# Patient Record
Sex: Male | Born: 1940
Health system: Southern US, Community
[De-identification: ages and names within clinical notes are randomized; demographics above are authoritative.]

## PROBLEM LIST (undated history)

## (undated) DIAGNOSIS — H905 Unspecified sensorineural hearing loss: Secondary | ICD-10-CM

## (undated) DIAGNOSIS — B009 Herpesviral infection, unspecified: Secondary | ICD-10-CM

## (undated) DIAGNOSIS — Z87898 Personal history of other specified conditions: Secondary | ICD-10-CM

## (undated) DIAGNOSIS — M542 Cervicalgia: Secondary | ICD-10-CM

## (undated) DIAGNOSIS — E785 Hyperlipidemia, unspecified: Secondary | ICD-10-CM

## (undated) DIAGNOSIS — Z9889 Other specified postprocedural states: Secondary | ICD-10-CM

## (undated) DIAGNOSIS — H409 Unspecified glaucoma: Secondary | ICD-10-CM

## (undated) DIAGNOSIS — M5430 Sciatica, unspecified side: Secondary | ICD-10-CM

## (undated) DIAGNOSIS — J45909 Unspecified asthma, uncomplicated: Secondary | ICD-10-CM

## (undated) DIAGNOSIS — K589 Irritable bowel syndrome without diarrhea: Secondary | ICD-10-CM

## (undated) HISTORY — DX: Herpesviral infection, unspecified: B00.9

## (undated) HISTORY — DX: Other specified postprocedural states: Z98.89

## (undated) HISTORY — DX: Unspecified asthma, uncomplicated: J45.909

## (undated) HISTORY — DX: Personal history of other specified conditions: Z87.898

## (undated) HISTORY — PX: WRIST SURGERY: SHX841

## (undated) HISTORY — DX: Sciatica, unspecified side: M54.30

## (undated) HISTORY — DX: Unspecified sensorineural hearing loss: H90.5

## (undated) HISTORY — PX: OTHER SURGICAL HISTORY: SHX169

## (undated) HISTORY — DX: Hyperlipidemia, unspecified: E78.5

## (undated) HISTORY — DX: Irritable bowel syndrome without diarrhea: K58.9

## (undated) HISTORY — DX: Cervicalgia: M54.2

## (undated) HISTORY — DX: Unspecified glaucoma: H40.9

---

## 2001-09-09 ENCOUNTER — Encounter: Payer: Self-pay | Admitting: Orthopaedic Surgery

## 2001-09-09 ENCOUNTER — Encounter: Admission: RE | Admit: 2001-09-09 | Discharge: 2001-09-09 | Payer: Self-pay | Admitting: Orthopaedic Surgery

## 2002-03-02 ENCOUNTER — Encounter: Payer: Self-pay | Admitting: Orthopaedic Surgery

## 2002-03-02 ENCOUNTER — Ambulatory Visit (HOSPITAL_COMMUNITY): Admission: RE | Admit: 2002-03-02 | Discharge: 2002-03-02 | Payer: Self-pay | Admitting: Orthopaedic Surgery

## 2003-08-28 ENCOUNTER — Emergency Department (HOSPITAL_COMMUNITY): Admission: EM | Admit: 2003-08-28 | Discharge: 2003-08-28 | Payer: Self-pay

## 2004-06-05 HISTORY — PX: COLONOSCOPY: SHX174

## 2004-06-05 LAB — HM COLONOSCOPY: HM Colonoscopy: NORMAL

## 2004-09-10 ENCOUNTER — Ambulatory Visit: Payer: Self-pay | Admitting: Internal Medicine

## 2004-09-17 ENCOUNTER — Ambulatory Visit: Payer: Self-pay | Admitting: Internal Medicine

## 2005-11-12 ENCOUNTER — Ambulatory Visit: Payer: Self-pay | Admitting: Internal Medicine

## 2005-11-18 ENCOUNTER — Ambulatory Visit: Payer: Self-pay | Admitting: Internal Medicine

## 2006-12-29 ENCOUNTER — Ambulatory Visit: Payer: Self-pay | Admitting: Internal Medicine

## 2006-12-29 LAB — CONVERTED CEMR LAB
ALT: 34 units/L (ref 0–40)
AST: 27 units/L (ref 0–37)
Albumin: 3.8 g/dL (ref 3.5–5.2)
Alkaline Phosphatase: 55 units/L (ref 39–117)
BUN: 20 mg/dL (ref 6–23)
Basophils Absolute: 0 10*3/uL (ref 0.0–0.1)
Basophils Relative: 0.2 % (ref 0.0–1.0)
Bilirubin Urine: NEGATIVE
Bilirubin, Direct: 0.1 mg/dL (ref 0.0–0.3)
CO2: 29 meq/L (ref 19–32)
Calcium: 9.2 mg/dL (ref 8.4–10.5)
Chloride: 107 meq/L (ref 96–112)
Cholesterol: 165 mg/dL (ref 0–200)
Creatinine, Ser: 0.9 mg/dL (ref 0.4–1.5)
Eosinophils Absolute: 0.2 10*3/uL (ref 0.0–0.6)
Eosinophils Relative: 3.3 % (ref 0.0–5.0)
GFR calc Af Amer: 109 mL/min
GFR calc non Af Amer: 90 mL/min
Glucose, Bld: 99 mg/dL (ref 70–99)
HCT: 35.4 % — ABNORMAL LOW (ref 39.0–52.0)
HDL: 55.2 mg/dL (ref >39.0)
Hemoglobin, Urine: NEGATIVE
Hemoglobin: 12.1 g/dL — ABNORMAL LOW (ref 13.0–17.0)
Ketones, ur: NEGATIVE mg/dL
LDL Cholesterol: 96 mg/dL (ref 0–99)
Leukocytes, UA: NEGATIVE
Lymphocytes Relative: 26 % (ref 12.0–46.0)
MCHC: 34.1 g/dL (ref 30.0–36.0)
MCV: 97.7 fL (ref 78.0–100.0)
Monocytes Absolute: 0.5 10*3/uL (ref 0.2–0.7)
Monocytes Relative: 10.6 % (ref 3.0–11.0)
Neutro Abs: 3 10*3/uL (ref 1.4–7.7)
Neutrophils Relative %: 59.9 % (ref 43.0–77.0)
Nitrite: NEGATIVE
PSA: 0.08 ng/mL — ABNORMAL LOW (ref 0.10–4.00)
Platelets: 198 10*3/uL (ref 150–400)
Potassium: 4.4 meq/L (ref 3.5–5.1)
RBC: 3.62 M/uL — ABNORMAL LOW (ref 4.22–5.81)
RDW: 12.9 % (ref 11.5–14.6)
Sodium: 143 meq/L (ref 135–145)
Specific Gravity, Urine: <=1.005 (ref 1.000–1.03)
TSH: 1.71 microintl units/mL (ref 0.35–5.50)
Total Bilirubin: 1 mg/dL (ref 0.3–1.2)
Total CHOL/HDL Ratio: 3
Total Protein, Urine: NEGATIVE mg/dL
Total Protein: 6.6 g/dL (ref 6.0–8.3)
Triglycerides: 67 mg/dL (ref 0–149)
Urine Glucose: NEGATIVE mg/dL
Urobilinogen, UA: 0.2 (ref 0.0–1.0)
VLDL: 13 mg/dL (ref 0–40)
WBC: 5 10*3/uL (ref 4.5–10.5)
pH: 6.5 (ref 5.0–8.0)

## 2007-01-08 ENCOUNTER — Ambulatory Visit: Payer: Self-pay | Admitting: Internal Medicine

## 2008-02-23 ENCOUNTER — Encounter: Payer: Self-pay | Admitting: *Deleted

## 2008-02-23 DIAGNOSIS — J45909 Unspecified asthma, uncomplicated: Secondary | ICD-10-CM | POA: Insufficient documentation

## 2008-02-23 DIAGNOSIS — Z87898 Personal history of other specified conditions: Secondary | ICD-10-CM

## 2008-02-23 DIAGNOSIS — B009 Herpesviral infection, unspecified: Secondary | ICD-10-CM | POA: Insufficient documentation

## 2008-02-23 DIAGNOSIS — K589 Irritable bowel syndrome without diarrhea: Secondary | ICD-10-CM | POA: Insufficient documentation

## 2008-02-23 DIAGNOSIS — Z9889 Other specified postprocedural states: Secondary | ICD-10-CM | POA: Insufficient documentation

## 2008-02-23 DIAGNOSIS — E785 Hyperlipidemia, unspecified: Secondary | ICD-10-CM

## 2008-02-23 HISTORY — DX: Hyperlipidemia, unspecified: E78.5

## 2008-02-23 HISTORY — DX: Other specified postprocedural states: Z98.89

## 2008-02-23 HISTORY — DX: Personal history of other specified conditions: Z87.898

## 2008-02-23 HISTORY — DX: Unspecified asthma, uncomplicated: J45.909

## 2008-02-23 HISTORY — DX: Herpesviral infection, unspecified: B00.9

## 2008-02-23 HISTORY — DX: Irritable bowel syndrome, unspecified: K58.9

## 2008-04-15 ENCOUNTER — Ambulatory Visit: Payer: Self-pay | Admitting: Internal Medicine

## 2008-04-15 LAB — CONVERTED CEMR LAB
ALT: 25 units/L (ref 0–53)
AST: 22 units/L (ref 0–37)
Albumin: 3.8 g/dL (ref 3.5–5.2)
Alkaline Phosphatase: 63 units/L (ref 39–117)
BUN: 19 mg/dL (ref 6–23)
Basophils Absolute: 0 10*3/uL (ref 0.0–0.1)
Basophils Relative: 0 % (ref 0.0–1.0)
Bilirubin Urine: NEGATIVE
Bilirubin, Direct: 0.2 mg/dL (ref 0.0–0.3)
CO2: 28 meq/L (ref 19–32)
Calcium: 8.9 mg/dL (ref 8.4–10.5)
Chloride: 106 meq/L (ref 96–112)
Cholesterol: 147 mg/dL (ref 0–200)
Creatinine, Ser: 0.9 mg/dL (ref 0.4–1.5)
Eosinophils Absolute: 0.1 10*3/uL (ref 0.0–0.7)
Eosinophils Relative: 2.5 % (ref 0.0–5.0)
GFR calc Af Amer: 109 mL/min
GFR calc non Af Amer: 90 mL/min
Glucose, Bld: 106 mg/dL — ABNORMAL HIGH (ref 70–99)
HCT: 41.7 % (ref 39.0–52.0)
HDL: 51.1 mg/dL (ref >39.0)
Hemoglobin: 14.4 g/dL (ref 13.0–17.0)
Ketones, ur: NEGATIVE mg/dL
LDL Cholesterol: 86 mg/dL (ref 0–99)
Leukocytes, UA: NEGATIVE
Lymphocytes Relative: 24.4 % (ref 12.0–46.0)
MCHC: 34.6 g/dL (ref 30.0–36.0)
MCV: 98.5 fL (ref 78.0–100.0)
Monocytes Absolute: 0.5 10*3/uL (ref 0.1–1.0)
Monocytes Relative: 8.6 % (ref 3.0–12.0)
Neutro Abs: 3.8 10*3/uL (ref 1.4–7.7)
Neutrophils Relative %: 64.5 % (ref 43.0–77.0)
Nitrite: NEGATIVE
PSA: 0.09 ng/mL — ABNORMAL LOW (ref 0.10–4.00)
Platelets: 243 10*3/uL (ref 150–400)
Potassium: 4.4 meq/L (ref 3.5–5.1)
RBC: 4.23 M/uL (ref 4.22–5.81)
RDW: 12.8 % (ref 11.5–14.6)
Sodium: 141 meq/L (ref 135–145)
Specific Gravity, Urine: 1.015 (ref 1.000–1.03)
TSH: 1.16 microintl units/mL (ref 0.35–5.50)
Total Bilirubin: 1 mg/dL (ref 0.3–1.2)
Total CHOL/HDL Ratio: 2.9
Total Protein, Urine: NEGATIVE mg/dL
Total Protein: 6.5 g/dL (ref 6.0–8.3)
Triglycerides: 52 mg/dL (ref 0–149)
Urine Glucose: NEGATIVE mg/dL
Urobilinogen, UA: 0.2 (ref 0.0–1.0)
VLDL: 10 mg/dL (ref 0–40)
WBC: 5.8 10*3/uL (ref 4.5–10.5)
pH: 6.5 (ref 5.0–8.0)

## 2008-04-22 ENCOUNTER — Ambulatory Visit: Payer: Self-pay | Admitting: Internal Medicine

## 2008-04-22 DIAGNOSIS — M542 Cervicalgia: Secondary | ICD-10-CM | POA: Insufficient documentation

## 2008-04-22 DIAGNOSIS — M5430 Sciatica, unspecified side: Secondary | ICD-10-CM | POA: Insufficient documentation

## 2008-04-22 HISTORY — DX: Cervicalgia: M54.2

## 2008-04-22 HISTORY — DX: Sciatica, unspecified side: M54.30

## 2009-05-02 ENCOUNTER — Ambulatory Visit: Payer: Self-pay | Admitting: Internal Medicine

## 2009-05-02 LAB — CONVERTED CEMR LAB
ALT: 23 units/L (ref 0–53)
AST: 24 units/L (ref 0–37)
Albumin: 4 g/dL (ref 3.5–5.2)
Alkaline Phosphatase: 81 units/L (ref 39–117)
BUN: 18 mg/dL (ref 6–23)
Basophils Absolute: 0 10*3/uL (ref 0.0–0.1)
Basophils Relative: 0.2 % (ref 0.0–3.0)
Bilirubin, Direct: 0.1 mg/dL (ref 0.0–0.3)
CO2: 29 meq/L (ref 19–32)
Calcium: 9 mg/dL (ref 8.4–10.5)
Chloride: 106 meq/L (ref 96–112)
Cholesterol: 151 mg/dL (ref 0–200)
Creatinine, Ser: 0.8 mg/dL (ref 0.4–1.5)
Eosinophils Absolute: 0.1 10*3/uL (ref 0.0–0.7)
Eosinophils Relative: 2 % (ref 0.0–5.0)
GFR calc non Af Amer: 102.64 mL/min (ref >60)
Glucose, Bld: 82 mg/dL (ref 70–99)
HCT: 40.9 % (ref 39.0–52.0)
HDL: 52 mg/dL (ref >39.00)
Hemoglobin: 13.9 g/dL (ref 13.0–17.0)
LDL Cholesterol: 85 mg/dL (ref 0–99)
Lymphocytes Relative: 20.6 % (ref 12.0–46.0)
Lymphs Abs: 1.5 10*3/uL (ref 0.7–4.0)
MCHC: 34.1 g/dL (ref 30.0–36.0)
MCV: 99.7 fL (ref 78.0–100.0)
Monocytes Absolute: 0.6 10*3/uL (ref 0.1–1.0)
Monocytes Relative: 8.8 % (ref 3.0–12.0)
Neutro Abs: 5 10*3/uL (ref 1.4–7.7)
Neutrophils Relative %: 68.4 % (ref 43.0–77.0)
PSA: 0.11 ng/mL (ref 0.10–4.00)
Platelets: 228 10*3/uL (ref 150.0–400.0)
Potassium: 4.2 meq/L (ref 3.5–5.1)
RBC: 4.1 M/uL — ABNORMAL LOW (ref 4.22–5.81)
RDW: 13 % (ref 11.5–14.6)
Sodium: 143 meq/L (ref 135–145)
Total Bilirubin: 0.8 mg/dL (ref 0.3–1.2)
Total CHOL/HDL Ratio: 3
Total Protein: 6.8 g/dL (ref 6.0–8.3)
Triglycerides: 72 mg/dL (ref 0.0–149.0)
VLDL: 14.4 mg/dL (ref 0.0–40.0)
WBC: 7.2 10*3/uL (ref 4.5–10.5)

## 2009-11-27 ENCOUNTER — Ambulatory Visit: Payer: Self-pay | Admitting: Internal Medicine

## 2009-12-01 ENCOUNTER — Telehealth: Payer: Self-pay | Admitting: Internal Medicine

## 2009-12-05 ENCOUNTER — Encounter: Payer: Self-pay | Admitting: Internal Medicine

## 2010-05-04 ENCOUNTER — Ambulatory Visit: Payer: Self-pay | Admitting: Internal Medicine

## 2010-05-04 DIAGNOSIS — H905 Unspecified sensorineural hearing loss: Secondary | ICD-10-CM | POA: Insufficient documentation

## 2010-05-04 HISTORY — DX: Unspecified sensorineural hearing loss: H90.5

## 2010-05-10 ENCOUNTER — Telehealth: Payer: Self-pay | Admitting: Internal Medicine

## 2010-05-15 ENCOUNTER — Ambulatory Visit (HOSPITAL_COMMUNITY): Admission: RE | Admit: 2010-05-15 | Discharge: 2010-05-15 | Payer: Self-pay | Admitting: Internal Medicine

## 2010-05-23 ENCOUNTER — Telehealth: Payer: Self-pay | Admitting: Internal Medicine

## 2010-06-08 ENCOUNTER — Ambulatory Visit: Payer: Self-pay | Admitting: Internal Medicine

## 2010-07-10 ENCOUNTER — Ambulatory Visit: Payer: Self-pay | Admitting: Internal Medicine

## 2010-07-10 LAB — CONVERTED CEMR LAB
Calcium: 8.9 mg/dL (ref 8.4–10.5)
GFR calc non Af Amer: 87.04 mL/min (ref >60)
Glucose, Bld: 86 mg/dL (ref 70–99)
Potassium: 5 meq/L (ref 3.5–5.1)
Sodium: 142 meq/L (ref 135–145)

## 2010-07-15 ENCOUNTER — Telehealth: Payer: Self-pay | Admitting: Internal Medicine

## 2010-08-14 ENCOUNTER — Telehealth: Payer: Self-pay | Admitting: Internal Medicine

## 2010-12-02 LAB — CONVERTED CEMR LAB
ALT: 24 units/L (ref 0–53)
AST: 26 units/L (ref 0–37)
Albumin: 4.5 g/dL (ref 3.5–5.2)
Alkaline Phosphatase: 68 units/L (ref 39–117)
BUN: 26 mg/dL — ABNORMAL HIGH (ref 6–23)
Calcium: 9.3 mg/dL (ref 8.4–10.5)
Chloride: 109 meq/L (ref 96–112)
Cholesterol: 161 mg/dL (ref 0–200)
Creatinine, Ser: 0.8 mg/dL (ref 0.4–1.5)
HDL: 56.5 mg/dL (ref >39.00)
TSH: 0.85 microintl units/mL (ref 0.35–5.50)
Total CHOL/HDL Ratio: 3
Total Protein: 7.1 g/dL (ref 6.0–8.3)
Triglycerides: 54 mg/dL (ref 0.0–149.0)

## 2010-12-04 NOTE — Consult Note (Signed)
Summary: [facility]  [facility]   Imported By: Sherian Rein 12/11/2009 10:06:20  _____________________________________________________________________  External Attachment:    Type:   Image     Comment:   External Document

## 2010-12-04 NOTE — Assessment & Plan Note (Signed)
Summary: yearly  stc   Vital Signs:  Patient profile:   70 year old male Height:      69 inches Weight:      166 pounds BMI:     24.60 O2 Sat:      97 % on Room air Temp:     98.2 degrees F oral Pulse rate:   62 / minute BP sitting:   162 / 88  (left arm) Cuff size:   regular  Vitals Entered By: Bill Salinas CMA (May 04, 2010 10:12 AM)  O2 Flow:  Room air CC: pt here for cpx/ he will get his tetanus shot today/ ab  Vision Screening:      Vision Comments: Pt had last eye exam about Feb 2011, pt has pigment prob in right eye. He recieves eye exams every 6 months   Primary Care Provider:  Jacques Navy MD  CC:  pt here for cpx/ he will get his tetanus shot today/ ab.  History of Present Illness: Patient presents for routine medical exam since. IN the interval he has been doing well. He continues to have hearing loss right, feeling of pressure build up that at times becomes very painful on the right and he has selective high frequency hearing loss as well as hyperacusia.   He  is otherwise doing well with no other medical complaints.  Preventive Screening-Counseling & Management  Alcohol-Tobacco     Alcohol drinks/day: <1     Alcohol type: all     Smoking Status: quit     Smoking Cessation Counseling: no     Year Quit: '78  Clinical Review Panels:  Immunizations   Last Tetanus Booster:  Tdap (05/04/2010)   Last Flu Vaccine:  given (08/25/2009)   Last Zoster Vaccine:  Zostavax (01/08/2007)   Current Medications (verified): 1)  Lipitor 10 Mg  Tabs (Atorvastatin Calcium) .... Take 1 Tablet By Mouth Once A Day 2)  Propecia 1 Mg  Tabs (Finasteride) .... Take Once Daily 3)  Multivitamins   Tabs (Multiple Vitamin) .... Take Once Daily 4)  Aspirin 325 Mg  Tabs (Aspirin) .... Take Once Daily 5)  Fish Oil   Oil (Fish Oil) .... Take Once Daily 6)  Timoptic 0.5 %  Soln (Timolol Maleate) .... Use Daily As Directed. 7)  Lorazepam 1 Mg  Tabs (Lorazepam) .... Take Daily As  Needed 8)  Valtrex 500 Mg  Tabs (Valacyclovir Hcl) .... Take 2 Tablets Daily As Needed 9)  Hyoscyamine Sulfate Cr 0.375 Mg  Cp12 (Hyoscyamine Sulfate) .... Take Two Times A Day As Needed 10)  Vitamin D 400 Unit Caps (Cholecalciferol) .Marland Kitchen.. 1 Cap Daily 11)  Vitamin C Cr 500 Mg Cr-Caps (Ascorbic Acid) .Marland Kitchen.. 1 Cap Daily  Allergies (verified): No Known Drug Allergies  Past History:  Past Medical History: Last updated: 02/23/2008 HYPERLIPIDEMIA (ICD-272.4) IRRITABLE BOWEL SYNDROME (ICD-564.1) HERPES SIMPLEX INFECTION, TYPE I (ICD-054.9) ASTHMA, CHILDHOOD (ICD-493.00) HEADACHES, HX OF (ICD-V13.8)    Past Surgical History: Last updated: 02/23/2008 ARTHROSCOPY, LEFT KNEE, HX OF (ICD-V45.89) * SURGICAL REPAIR OF WRIST. 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  The patient denies anorexia, fever, weight loss, weight gain, hoarseness, chest pain, syncope, dyspnea on exertion, peripheral edema, headaches, abdominal pain, hematochezia, hematuria, incontinence, muscle weakness, suspicious skin lesions, transient blindness, difficulty walking, abnormal bleeding, enlarged lymph nodes, and testicular masses.    Physical Exam  General:  Well-developed,well-nourished,in no acute distress; alert,appropriate and cooperative throughout examination Head:  Normocephalic and atraumatic without obvious abnormalities. No apparent alopecia or balding. Eyes:  No corneal or conjunctival inflammation noted. EOMI. Perrla. Funduscopic exam benign, without hemorrhages, exudates or papilledema.  Vision grossly normal. Ears:  External ear exam shows no significant lesions or deformities.  Otoscopic examination reveals clear canals, tympanic membranes are intact bilaterally without bulging, retraction, inflammation or discharge. Nose:  no external deformity and no external erythema.   Mouth:  Oral mucosa and oropharynx without lesions or exudates.  Teeth in good repair. Neck:  supple, full ROM, no thyromegaly, and no carotid bruits.   Chest Wall:  No deformities, masses, tenderness or gynecomastia noted. Lungs:  Normal respiratory effort, chest expands symmetrically. Lungs are clear to auscultation, no crackles or wheezes. Heart:  Normal rate and regular rhythm. S1 and S2 normal without gallop, murmur, click, rub or other extra sounds. Abdomen:  soft, non-tender, normal bowel sounds, no masses, no guarding, and no hepatomegaly.   Rectal:  No external abnormalities noted. Normal sphincter tone. No rectal masses or tenderness. Prostate:  Prostate gland firm and smooth, no enlargement, nodularity, tenderness, mass, asymmetry or induration. Msk:  normal ROM, no joint tenderness, no joint swelling, no joint warmth, and no redness over joints.   Pulses:  2+ radial and DP pulses Extremities:  No clubbing, cyanosis, edema, or deformity noted with normal full range of motion of all joints.   Neurologic:  No cranial nerve deficits noted. Station and gait are normal. Plantar reflexes are down-going bilaterally. DTRs are symmetrical throughout. Sensory, motor and coordinative functions appear intact. Skin:  turgor normal, color normal, no rashes, no suspicious lesions, and no ulcerations.   Cervical Nodes:  no anterior cervical adenopathy and no posterior cervical adenopathy.   Inguinal Nodes:  no R inguinal adenopathy and no L inguinal adenopathy.   Psych:  Oriented X3, memory intact for recent and remote, normally interactive, and good eye contact.     Impression & Recommendations:  Problem # 1:   SENSORINEURAL HEARING LOSS UNILATERAL (ICD-389.15) Patient continues to have hearing loss at selective frequencies along with hyperacusia. He also has sensation of pressure in the ears suggestive of eustachian tube dysfunction. With persistent symptoms despite trails of steroids and decongestants need to r/o structual abnormalities or mass effect, e.g. acoustic neuroma  Plan - MRI brain w/o contrast with special attention to eustachian tube structure.           trial of nasal inhalational steroid, Nasonex, to relieve eustachian dysfunction.  Orders: Radiology Referral (Radiology)  Problem # 2:  SCIATICA (ICD-724.3) No active symptoms at this time.  His updated medication list for this problem includes:    Aspirin 325 Mg Tabs (Aspirin) .Marland Kitchen... Take once daily  Problem # 3:  HYPERLIPIDEMIA (ICD-272.4) Due for routine lab with recommendations to follow.  His updated medication list for this problem includes:    Lipitor 10 Mg Tabs (Atorvastatin calcium) .Marland Kitchen... Take 1 tablet by mouth once a day  Orders: TLB-Lipid Panel (80061-LIPID) TLB-Hepatic/Liver Function Pnl (80076-HEPATIC) TLB-TSH (Thyroid Stimulating Hormone) (84443-TSH)  Addendum - at goal with LDL 94, HDL 56.5  Plan - continue present medication  Problem # 4:  IRRITABLE BOWEL SYNDROME (ICD-564.1) No active symptoms at this time.  Problem # 5:  Preventive Health Care (ICD-V70.0) Except for hearing issues an unremarkable interval history. Exam is normal. Lab results are excellent. Current with colorectal cancer screening. Normal prostate exam. Due for pneumonia vaccine at his convenience.  He is doing very well in retirement with a very positive attitude. No fall risk and no injury risk - he is careful with his chores and such things as heavy lifting.  In summary - a very nice gentleman who is medically stable except for on-going concern about hearing. He will be notified of MRI results when available. He will return on a as  needed basis or 1 year.   Complete Medication List: 1)  Lipitor 10 Mg Tabs (Atorvastatin calcium) .... Take 1 tablet by mouth once a day 2)  Propecia 1 Mg Tabs (Finasteride) .... Take once daily 3)  Multivitamins Tabs (Multiple vitamin) .... Take once daily 4)  Aspirin 325 Mg Tabs (Aspirin) .... Take once daily 5)  Fish Oil Oil (Fish oil) .... Take once daily 6)  Timoptic 0.5 % Soln (Timolol maleate) .... Use daily as directed. 7)  Lorazepam 1 Mg Tabs (Lorazepam) .... Take daily as needed 8)  Valtrex 500 Mg Tabs (Valacyclovir hcl) .... Take 2 tablets daily as needed 9)  Hyoscyamine Sulfate Cr 0.375 Mg Cp12 (Hyoscyamine sulfate) .... Take two times a day as needed 10)  Vitamin D 400 Unit Caps (Cholecalciferol) .Marland Kitchen.. 1 cap daily 11)  Vitamin C Cr 500 Mg Cr-caps (Ascorbic acid) .Marland Kitchen.. 1 cap daily  Other Orders: Tdap => 7yrs IM ([address]) Admin 1st Vaccine (60454) MMR Vaccine SQ (09811) Admin of Any Addtl Vaccine (91478) TLB-BMP (Basic Metabolic Panel-BMET) (80048-METABOL)  Patient: Timothy Savage Note: All result statuses are Final unless otherwise noted.  Tests: (1) Lipid Panel (LIPID)   Cholesterol               161 mg/dL                   2-956     ATP III Classification            Desirable:  < 200 mg/dL                    Borderline High:  200 - 239 mg/dL               High:  > = 240 mg/dL   Triglycerides             54.0 mg/dL                  2.1-308.6     Normal:  <150 mg/dL     Borderline High:  578 - 199 mg/dL   HDL                       46.96 mg/dL                 >29.52   VLDL Cholesterol          10.8 mg/dL                  8.4-13.2   LDL Cholesterol           94 mg/dL  0-99  CHO/HDL Ratio:  CHD Risk                             3                    Men          Women     1/2 Average Risk     3.4          3.3     Average Risk          5.0          4.4     2X Average Risk          9.6          7.1     3X Average Risk          15.0          11.0                            Tests: (2) Hepatic/Liver Function Panel (HEPATIC)   Total Bilirubin           1.1 mg/dL                   5.7-8.4   Direct Bilirubin          0.2 mg/dL                   6.9-6.2   Alkaline Phosphatase      68 U/L                      39-117   AST                       26 U/L                      0-37   ALT                       24 U/L                      0-53   Total Protein             7.1 g/dL                    9.5-2.8   Albumin                   4.5 g/dL                    4.1-3.2  Tests: (3) TSH (TSH)   FastTSH                   0.85 uIU/mL                 0.35-5.50  Tests: (4) BMP (METABOL)   Sodium                    143 mEq/L                   135-145   Potassium                 4.5 mEq/L  3.5-5.1   Chloride                  109 mEq/L                   96-112   Carbon Dioxide            28 mEq/L                    19-32   Glucose                   88 mg/dL                    56-43   BUN                  [H]  26 mg/dL                    3-29   Creatinine                0.8 mg/dL                   5.1-8.8   Calcium                   9.3 mg/dL                   4.1-66.0   GFR                       99.46 mL/min                >60   Immunizations Administered:  Tetanus Vaccine:    Vaccine Type: Tdap    Site: left deltoid    Mfr: GlaxoSmithKline    Dose: 0.5 ml    Route: IM    Given by: Ami Bullins CMA    Exp. Date: 01/26/2012    Lot #: [id number]    VIS given: 09/22/07 version given May 04, 2010.  MMR Vaccine # 1:    Vaccine Type: MMR    Site: right deltoid    Mfr: Merck    Dose: 0.5 ml    Route: IM    Given by: Ami Bullins CMA    Exp. Date: 10/26/2011    Lot #: [id number]    VIS given: 01/15/07 version given May 04, 2010.

## 2010-12-04 NOTE — Progress Notes (Signed)
Summary: RESULTS  Phone Note Call from Patient Call back at Home Phone [phone]   Summary of Call: Patient is requesting results of MRI.  Initial call taken by: Lamar Sprinkles, CMA,  May 23, 2010 11:12 AM  Follow-up for Phone Call        my apologies for being tardy. Normal study - no cause of symptoms identified on MRI brain. Follow-up by: Jacques Navy MD,  May 23, 2010 12:24 PM  Additional Follow-up for Phone Call Additional follow up Details #1::        informed pt of results Additional Follow-up by: Ami Bullins CMA,  May 23, 2010 1:13 PM

## 2010-12-04 NOTE — Progress Notes (Signed)
Summary: refill req  Phone Note Refill Request Message from:  Fax from Pharmacy on August 14, 2010 4:06 PM  Refills Requested: Medication #1:  LORAZEPAM 1 MG  TABS Take daily as needed   Last Refilled: 05/11/2010   Notes: [facility] please advise  Initial call taken by: Brenton Grills MA,  August 14, 2010 4:06 PM  Follow-up for Phone Call        ok for refill x 5 Follow-up by: Jacques Navy MD,  August 14, 2010 6:06 PM    Prescriptions: LORAZEPAM 1 MG  TABS (LORAZEPAM) Take daily as needed  #90 x 5   Entered by:   Ami Bullins CMA   Authorized by:   Jacques Navy MD   Signed by:   Bill Salinas CMA on 08/15/2010   Method used:   Telephoned to ...       [facility] [address] (retail)       [address]       [place], [place]  [address]       Ph: [phone]       Fax: [phone]   RxID:   [id number]

## 2010-12-04 NOTE — Progress Notes (Signed)
  Phone Note Refill Request Message from:  Fax from Pharmacy  Refills Requested: Medication #1:  LORAZEPAM 1 MG  TABS Take daily as needed recieved fax from [facility] on [address], Please Advise refill in absence of Dr Debby Bud  Initial call taken by: Ami Bullins CMA,  May 10, 2010 3:26 PM  Follow-up for Phone Call        done hardcopy to [facility] side B - dahlia  Follow-up by: Corwin Levins MD,  May 10, 2010 5:37 PM  Additional Follow-up for Phone Call Additional follow up Details #1::        Rx faxed to pharmacy Additional Follow-up by: Margaret Pyle, CMA,  May 11, 2010 8:06 AM    New/Updated Medications: LORAZEPAM 1 MG  TABS (LORAZEPAM) Take daily as needed Prescriptions: LORAZEPAM 1 MG  TABS (LORAZEPAM) Take daily as needed  #90 x 0   Entered and Authorized by:   Corwin Levins MD   Signed by:   Corwin Levins MD on 05/10/2010   Method used:   Print then Give to Patient   RxID:   [id number]

## 2010-12-04 NOTE — Progress Notes (Signed)
  Phone Note Outgoing Call   Reason for Call: Discuss lab or test results Summary of Call: Please call patient: metabolic panel normal - no change with addition of losartan. How is the BP ??  Thanks Initial call taken by: Jacques Navy MD,  July 15, 2010 9:42 PM  Follow-up for Phone Call        informed pt of results. Pt states no drastic change but is a little lower (high 120s over mid 70's). Pt states BP still fluctuates quite a bit. Follow-up by: Ami Bullins CMA,  July 16, 2010 2:37 PM

## 2010-12-04 NOTE — Progress Notes (Signed)
Summary: REQ FOR RX? / Timothy Savage pt  Phone Note Call from Patient Call back at [phone]   Summary of Call: Pt was told to call back if ear problem became worse. Pt c/o increase in ear pressure, pain, and bloody-green mucus from his nose. He wants to know if antibiotics will help.  ENT is not able to see pt until 12/13/09.  Initial call taken by: Lamar Sprinkles, CMA,  December 01, 2009 10:21 AM  Follow-up for Phone Call        [facility]  F/u w/Dr Kerin Kren Follow-up by: Tresa Garter MD,  December 01, 2009 11:57 AM  Additional Follow-up for Phone Call Additional follow up Details #1::        Pt informed  Additional Follow-up by: Lamar Sprinkles, CMA,  December 01, 2009 12:19 PM    New/Updated Medications: ZITHROMAX Z-PAK 250 MG TABS (AZITHROMYCIN) as dirrected Prescriptions: ZITHROMAX Z-PAK 250 MG TABS (AZITHROMYCIN) as dirrected  #1 x 0   Entered and Authorized by:   Tresa Garter MD   Signed by:   Lamar Sprinkles, CMA on 12/01/2009   Method used:   Electronically to        [facility] [address] (retail)       [address]       [place], [place]  [address]       Ph: [phone] or [phone]       Fax: [phone]   RxID:   [id number]

## 2010-12-04 NOTE — Assessment & Plan Note (Signed)
Summary: bp check/check home machine/SD  Medications Added LOSARTAN POTASSIUM 50 MG TABS (LOSARTAN POTASSIUM) 1 by mouth once daily       Nurse Visit   Vital Signs:  Patient profile:   70 year old male Pulse rate:   65 / minute BP sitting:   178 / 100  Vitals Entered By: Lamar Sprinkles, CMA (June 08, 2010 10:24 AM) CC: BP CHECK/elevated bp at last ov, verify home machine. Comments Pt in office today for BP check and to verify pt's home machine. Home machine prior to leaving is 135/86. Machine reading in office was 170/107. Manual reading was close at 178/100. Per pt, average home systolic readings are 130 to 139 and diastolic are upper 80's to low 90's. MD informed and discussed with patient. Reviewed prehypertension. Will start on ARB - losartan 50mg  daily. Follow-up lab in 4 weeks. Report back on BP readings.   Allergies: No Known Drug Allergies  Orders Added: 1)  Est. Patient Level I [84132] Prescriptions: LOSARTAN POTASSIUM 50 MG TABS (LOSARTAN POTASSIUM) 1 by mouth once daily  #30 x 12   Entered and Authorized by:   Jacques Navy MD   Signed by:   Jacques Navy MD on 06/08/2010   Method used:   Electronically to        [facility] [address] (retail)       [address]       [place], [place]  [address]       Ph: [phone] or [phone]       Fax: [phone]   RxID:   [id number]

## 2010-12-04 NOTE — Assessment & Plan Note (Signed)
Summary: hear loss, left ear x 2 days / SD   Vital Signs:  Patient profile:   70 year old male Height:      69 inches Weight:      169 pounds BMI:     25.05 O2 Sat:      97 % on Room air Temp:     98.2 degrees F oral Pulse rate:   62 / minute BP sitting:   168 / 108  (left arm) Cuff size:   regular  Vitals Entered By: Bill Salinas CMA (November 27, 2009 3:04 PM)  O2 Flow:  Room air CC: pt her with complaint of pressure and hearing loss from Left ear x 2 days/ ab   Primary Care Provider:  Jacques Navy MD  CC:  pt her with complaint of pressure and hearing loss from Left ear x 2 days/ ab.  History of Present Illness: Patient with a h/o tinnitus reight ear. He reports that he awoke sunday and could not hear out of the right ear. His tinnitus is louder along with swooshing sounds. He denies any pain, drain, recent air travel. He has had no URI symptoms, no fever, no trauma.   Current Medications (verified): 1)  Lipitor 10 Mg  Tabs (Atorvastatin Calcium) .... Take 1 Tablet By Mouth Once A Day 2)  Propecia 1 Mg  Tabs (Finasteride) .... Take Once Daily 3)  Multivitamins   Tabs (Multiple Vitamin) .... Take Once Daily 4)  Aspirin 325 Mg  Tabs (Aspirin) .... Take Once Daily 5)  Fish Oil   Oil (Fish Oil) .... Take Once Daily 6)  Timoptic 0.5 %  Soln (Timolol Maleate) .... Use Daily As Directed. 7)  Lorazepam 1 Mg  Tabs (Lorazepam) .... Take Daily As Needed 8)  Valtrex 500 Mg  Tabs (Valacyclovir Hcl) .... Take 2 Tablets Daily As Needed 9)  Hyoscyamine Sulfate Cr 0.375 Mg  Cp12 (Hyoscyamine Sulfate) .... Take Two Times A Day As Needed  Allergies (verified): No Known Drug Allergies  Review of Systems       The patient complains of decreased hearing.  The patient denies anorexia, fever, weight loss, weight gain, hoarseness, syncope, dyspnea on exertion, prolonged cough, hemoptysis, abdominal pain, muscle weakness, transient blindness, and unusual weight change.    Physical  Exam  General:  Well-developed,well-nourished,in no acute distress; alert,appropriate and cooperative throughout examination Head:  Normocephalic and atraumatic without obvious abnormalities. No apparent alopecia or balding. Ears:  EACs clear. TM's pearly. No perforation or visible abnormality. With insuffulation there is diminished movement TM right vs left but there is movement.  Neck:  supple and full ROM.   Lungs:  normal respiratory effort and normal breath sounds.   Heart:  normal rate and regular rhythm.   Neurologic:  alert & oriented X3, cranial nerves II-XII intact, and gait normal.     Impression & Recommendations:  Problem # 1:  HEARING LOSS, RIGHT EAR (ICD-389.9)  Unrevealing exam. Suspect possible eustachian tube dysfunction vs progressive sensori-neural hearing loss vs intra-cranial/CNS origin.  Plan  pseudoephedrine 30mg  three times a day         refer to audiology and ENT  Orders: Audiology (Audio)  Complete Medication List: 1)  Lipitor 10 Mg Tabs (Atorvastatin calcium) .... Take 1 tablet by mouth once a day 2)  Propecia 1 Mg Tabs (Finasteride) .... Take once daily 3)  Multivitamins Tabs (Multiple vitamin) .... Take once daily 4)  Aspirin 325 Mg Tabs (Aspirin) .... Take once daily  5)  Fish Oil Oil (Fish oil) .... Take once daily 6)  Timoptic 0.5 % Soln (Timolol maleate) .... Use daily as directed. 7)  Lorazepam 1 Mg Tabs (Lorazepam) .... Take daily as needed 8)  Valtrex 500 Mg Tabs (Valacyclovir hcl) .... Take 2 tablets daily as needed 9)  Hyoscyamine Sulfate Cr 0.375 Mg Cp12 (Hyoscyamine sulfate) .... Take two times a day as needed   Preventive Care Screening  Last Flu Shot:    Date:  08/25/2009    Results:  given

## 2011-02-26 ENCOUNTER — Other Ambulatory Visit: Payer: Self-pay | Admitting: *Deleted

## 2011-02-27 MED ORDER — LORAZEPAM 1 MG PO TABS: 1.0000 mg | ORAL_TABLET | Freq: Every day | ORAL | Status: DC | PRN

## 2011-02-27 NOTE — Telephone Encounter (Signed)
Ok to fill x 5

## 2011-02-27 NOTE — Telephone Encounter (Signed)
Called refill into pharmacy [facility]) had to leave on vm. Epic updated

## 2011-03-22 NOTE — Assessment & Plan Note (Signed)
[facility]                           PRIMARY CARE OFFICE NOTE   Timothy Savage, Timothy Savage                      MRN:          [id number]  DATE:01/08/2007                            DOB:          12/11/1940    Mr. Situ is a 70 year old gentleman who presents for routine follow up  evaluation and exam.  His last full physical was November 18, 2005.  I  have treated in the interval.  He is doing well with no new medical  complaints or problems.   PAST MEDICAL HISTORY:  Is well documented my note of September 17, 2004,  with no changes or additions.  Of note, the patient is currently up to  date with colonoscopy August of 2005, with followup in 2015.  He has  been screened for aneurysm in 2003 and this was negative.  Patient's  last EKG was a stress test in 2003.   CURRENT MEDICATIONS:  1. Propecia 1 mg daily.  2. Multivitamin daily.  3. Lipitor 10 mg daily.  4. Aspirin 325 mg daily.  5. Timolol eye drops 5%.  6. Hyoscyamine 0.375 mg b.i.d. p.r.n.  7. Lorazepam 1 mg daily p.r.n.  8. Valtrex 1000 mg daily p.r.n. for outbreak of fever blisters.   Patient has continued to see Dr. Charlotte Sanes, at [facility],  for his pigmentary dispersion syndrome and is being conservatively  managed for any possible early glaucoma risk with timolol eye drops.   REVIEW OF SYSTEMS:  Negative for any constitutional, cardiovascular,  respiratory, GI or GU complaints.   EXAMINATION:  Temperature 97.9, blood pressure 151/98, pulse 70, weight  165.8.  GENERAL APPEARANCE:  A well-nourished, slender, fit appearing Caucasian  male in no acute distress.  HEENT:  Normocephalic, atraumatic, EACs and TMs were normal.  Oropharynx  with native dentition in good repair.  No buccal or palatal lesions were  noted.  Posterior pharynx was clear.  Conjunctiva and sclera was clear.  PERRLA, EOMI.  Funduscopic exam was deferred to Dr. Charlotte Sanes.  NECK:  Supple without thyromegaly.  NOSE:  No adenopathy was noted in the cervical, supraclavicular or  inguinal region.  CHEST:  No CVA tenderness.  LUNGS:  Clear with no rales, wheezes or rhonchi.  CARDIOVASCULAR:  Radial pulse 2+, no JVD or carotid bruits.  He had a  quiet precordium with a regular rate and rhythm without murmurs, rubs or  gallops.  ABDOMEN:  Scaphoid with no organosplenomegaly, no guarding, no rebound,  no tenderness.  GENITALIA:  Normal male phallus, bilaterally descended testicles.  RECTAL:  Normal sphincter tone was noted.  Prostate was smooth, round,  normal in size and contour.  EXTREMITIES:  Without clubbing, cyanosis, edema or deformity.  NEUROLOGIC:  Nonfocal.   DATABASE:  A 12-lead electrocardiogram with a sinus bradycardia at 56  b.p.m., poor R wave in V1 but no real evidence of acute injury or  chronic injury.   LABORATORY:  Hemoglobin 12.1 g, white count was 5000 with a normal  differential.  Chemistries were unremarkable with a glucose of 99,  creatinine of 0.9.  Liver functions were normal.  Cholesterol 165,  triglycerides 67, HDL 55.2, LDL 96.  Thyroid function normal with a TSH  of 1.71, PSA was normal at 0.08.  Urinalysis was negative.   ASSESSMENT AND PLAN:  Hypertension.  Patient with mild white coat  hypertension with blood pressures running higher here than at home.  He  has had his blood pressure monitor checked for accuracy.   PLAN:  1. Patient to continue monitoring his blood pressure, no medication is      indicated.  2. Lipids.  The patient has had an excellent response to low-dose      Lipitor and will continue the same.  3. Ophthal per Dr. Charlotte Sanes.  Patient will continue on timolol eye      drops.  4. Health maintenance.  Patient is current with colorectal cancer      screening as noted.  He is given Zosta vaccine at today's visit.      He is also given pneumonia vaccine at today's visit.  Would      recommend he consider adult MMR, one dose, at his next annual       visit.   In summary,  this is a very pleasant gentleman who is medically stable  and healthy.  He will return as needed or in 1 year.     Rosalyn Gess Norins, MD  Electronically Signed    MEN/MedQ  DD: 01/08/2007  DT: 01/08/2007  Job #: [id number]   cc:   Newell Coral

## 2011-05-07 ENCOUNTER — Encounter: Payer: Self-pay | Admitting: Internal Medicine

## 2011-05-09 ENCOUNTER — Ambulatory Visit (INDEPENDENT_AMBULATORY_CARE_PROVIDER_SITE_OTHER): Payer: Medicare Other | Admitting: Internal Medicine

## 2011-05-09 ENCOUNTER — Encounter: Payer: Self-pay | Admitting: Internal Medicine

## 2011-05-09 ENCOUNTER — Other Ambulatory Visit (INDEPENDENT_AMBULATORY_CARE_PROVIDER_SITE_OTHER): Payer: Medicare Other

## 2011-05-09 VITALS — BP 146/90 | HR 64 | Temp 98.0°F | Wt 167.0 lb

## 2011-05-09 DIAGNOSIS — Z136 Encounter for screening for cardiovascular disorders: Secondary | ICD-10-CM

## 2011-05-09 DIAGNOSIS — Z0000 Encounter for general adult medical examination without abnormal findings: Secondary | ICD-10-CM

## 2011-05-09 DIAGNOSIS — Z125 Encounter for screening for malignant neoplasm of prostate: Secondary | ICD-10-CM

## 2011-05-09 DIAGNOSIS — T782XXA Anaphylactic shock, unspecified, initial encounter: Secondary | ICD-10-CM

## 2011-05-09 DIAGNOSIS — E785 Hyperlipidemia, unspecified: Secondary | ICD-10-CM

## 2011-05-09 DIAGNOSIS — H905 Unspecified sensorineural hearing loss: Secondary | ICD-10-CM

## 2011-05-09 LAB — HEPATIC FUNCTION PANEL
AST: 26 U/L (ref 0–37)
Alkaline Phosphatase: 69 U/L (ref 39–117)
Bilirubin, Direct: 0.1 mg/dL (ref 0.0–0.3)
Total Protein: 6.9 g/dL (ref 6.0–8.3)

## 2011-05-09 LAB — COMPREHENSIVE METABOLIC PANEL
AST: 26 U/L (ref 0–37)
BUN: 16 mg/dL (ref 6–23)
Calcium: 9.2 mg/dL (ref 8.4–10.5)
Chloride: 105 mEq/L (ref 96–112)
Creatinine, Ser: 0.9 mg/dL (ref 0.4–1.5)
GFR: 86.83 mL/min (ref >60.00)
Total Bilirubin: 1.1 mg/dL (ref 0.3–1.2)

## 2011-05-09 LAB — LIPID PANEL
Cholesterol: 158 mg/dL (ref 0–200)
HDL: 57.1 mg/dL (ref >39.00)
LDL Cholesterol: 88 mg/dL (ref 0–99)
Triglycerides: 64 mg/dL (ref 0.0–149.0)

## 2011-05-09 LAB — PSA: PSA: 0.09 ng/mL — ABNORMAL LOW (ref 0.10–4.00)

## 2011-05-09 MED ORDER — FINASTERIDE 1 MG PO TABS: 1.0000 mg | ORAL_TABLET | Freq: Every day | ORAL | Status: DC

## 2011-05-09 MED ORDER — VALACYCLOVIR HCL 500 MG PO TABS: 500.0000 mg | ORAL_TABLET | Freq: Every day | ORAL | Status: DC

## 2011-05-09 MED ORDER — ATORVASTATIN CALCIUM 10 MG PO TABS: 10.0000 mg | ORAL_TABLET | Freq: Every day | ORAL | Status: DC

## 2011-05-09 MED ORDER — HYOSCYAMINE SULFATE 0.375 MG PO TB12: 0.3750 mg | ORAL_TABLET | Freq: Two times a day (BID) | ORAL | Status: DC | PRN

## 2011-05-09 MED ORDER — LOSARTAN POTASSIUM 50 MG PO TABS: 50.0000 mg | ORAL_TABLET | Freq: Every day | ORAL | Status: DC

## 2011-05-09 NOTE — Assessment & Plan Note (Addendum)
Excellent control on present medication. There should be no intolerance problems due to generic product.   Plan - continue present medications.           For worsening muscle pain will need to check  CK total.

## 2011-05-09 NOTE — Assessment & Plan Note (Signed)
Interval history is negative. Physical exam in unremarkable. Lab results are in normal range. Colorectal cancer screening - last study August '05. PSA is very low and normal. Immunizations 0- Tdap - July '11; Shingles vaccine March '08; pneumonia vaccine - ?. 12 Lead EKG - poor R wave in V1-V2. This is unchanged from previous EKG. Patient had a pre-EMR stress test and the paper chart is being pulled for review.   IN summary - a very nice man who is medically stable at this time. He will return as needed or in 1 year.

## 2011-05-09 NOTE — Assessment & Plan Note (Signed)
Stable with no significant progression of hearing loss reported by the patient.  He is not ready for amplification or frequency modulating devices.

## 2011-05-09 NOTE — Progress Notes (Signed)
Subjective:    Patient ID: Timothy Savage, male    DOB: Sep 12, 1941, 70 y.o.   MRN: [id number]  HPI   The patient is here for annual Medicare wellness examination and management of other chronic and acute problems. He has switched to generic lipitor and now has some more soreness. Interval history is unchanged.     The risk factors are reflected in the social history.  The roster of all physicians providing medical care to patient - is listed in the Snapshot section of the chart.  Activities of daily living:  The patient is 100% inedpendent in all ADLs: dressing, toileting, feeding as well as independent mobility  Home safety : The patient has smoke detectors in the home. They wear seatbelts. Firearms are present in the home, kept in a safe fashion. There is no violence in the home.   There is no risks for hepatitis, STDs or HIV. There is no  history of blood transfusion. They have no travel history to infectious disease endemic areas of the world.  The patient has seen their dentist in the last six month. They have  seen their eye doctor in the last year. They admit to hearing difficulty right ear with some frequency modulation and have not had audiologic testing in the last year.  They do not  have excessive sun exposure. Discussed the need for sun protection: hats, long sleeves and use of sunscreen if there is significant sun exposure.   Diet: the importance of a healthy diet is discussed. They do have a healthy diet.  The patient has a regular exercise program: Walking,light weight lifting , 40 min duration, 2 to 3 times per week.  The benefits of regular aerobic exercise were discussed.  Depression screen: there are no signs or vegative symptoms of depression- irritability, change in appetite, anhedonia, sadness/tearfullness.  Cognitive assessment: the patient manages all their financial and personal affairs and is actively engaged. They could relate day,date,year and events; recalled  3/3 objects at 3 minutes; performed clock-face test normally.  The following portions of the patient's history were reviewed and updated as appropriate: allergies, current medications, past family history, past medical history,  past surgical history, past social history  and problem list.  Vision, hearing, body mass index were assessed and reviewed.   During the course of the visit the patient was educated and counseled about appropriate screening and preventive services including : fall prevention , diabetes screening, nutrition counseling, colorectal cancer screening, and recommended immunizations.   Review of Systems Review of Systems  Constitutional:  Negative for fever, chills, activity change and unexpected weight change.  HEENT:  Negative for hearing loss, ear pain, congestion, neck stiffness and postnasal drip. Negative for sore throat or swallowing problems. Negative for dental complaints.   Eyes: Negative for vision loss or change in visual acuity.  Respiratory: Negative for chest tightness and wheezing.   Cardiovascular: Negative for chest pain and palpitation. No decreased exercise tolerance Gastrointestinal: No change in bowel habit. No bloating or gas. No reflux or indigestion Genitourinary: Negative for urgency, frequency, flank pain and difficulty urinating.  Musculoskeletal: Negative for myalgias, back pain, and gait problem. Arthralgia - left knee - no major limitations in activities.  Neurological: Negative for dizziness, tremors, weakness and headaches.  Hematological: Negative for adenopathy.  Psychiatric/Behavioral: Negative for behavioral problems and dysphoric mood.       Objective:   Physical Exam Vital signs reviewed-stable Gen'l: Well nourished well developed whitemale in no acute distress who  looks younger than his stated age HEENT:  Head: Normocephalic and atraumatic.  Right Ear: External ear normal. EAC/TM nl Left Ear: External ear normal.  EAC/TM  nl Nose: Nose normal.  Mouth/Throat: Oropharynx is clear and moist. Dentition - native, in good repair. No buccal or palatal lesions. Posterior pharynx clear. Eyes: Conjunctivae and sclera clear. EOM intact. Pupils are equal, round, and reactive to light. Right eye exhibits no discharge. Left eye exhibits no discharge. Fundiscopic exam deferred to opthalmology Neck: Normal range of motion. Neck supple. No JVD present. No tracheal deviation present. No thyromegaly present.  Cardiovascular: Normal rate, regular rhythm, no gallop, no friction rub, no murmur heard.      Quiet precordium. 2+ radial and DP pulses . No carotid bruits Pulmonary/Chest: Effort normal. No respiratory distress or increased WOB, no wheezes, no rales. No chest wall deformity or CVAT. Abdominal: Soft. Bowel sounds are normal in all quadrants. He exhibits no distension, no tenderness, no rebound or guarding, No heptosplenomegaly  Genitourinary: deferred  Musculoskeletal: Normal range of motion. He exhibits no edema and no tenderness.       Small and large joints without redness, synovial thickening or deformity. Full range of motion preserved about all small, median and large joints.  Lymphadenopathy:    He has no cervical or supraclavicular adenopathy.  Neurological: He is alert and oriented to person, place, and time. CN II-XII intact. DTRs 2+ and symmetrical biceps, radial and patellar tendons. Cerebellar function normal with no tremor, rigidity, normal gait and station.  Skin: Skin is warm and dry. No rash noted. No erythema.  Psychiatric: He has a normal mood and affect. His behavior is normal. Thought content normal.   Lab Results  Component Value Date   WBC 7.2 05/02/2009   HGB 13.9 05/02/2009   HCT 40.9 05/02/2009   PLT 228.0 05/02/2009   CHOL 158 05/09/2011   TRIG 64.0 05/09/2011   HDL 57.10 05/09/2011   ALT 25 05/09/2011   ALT 25 05/09/2011   AST 26 05/09/2011   AST 26 05/09/2011   NA 138 05/09/2011   K 4.6 05/09/2011   CL 105  05/09/2011   CREATININE 0.9 05/09/2011   BUN 16 05/09/2011   CO2 28 05/09/2011   TSH 0.85 05/04/2010   PSA 0.09* 05/09/2011   Lab Results  Component Value Date   LDLCALC 88 05/09/2011            Assessment & Plan:

## 2011-05-10 LAB — CK TOTAL AND CKMB (NOT AT ARMC): Total CK: 114 U/L (ref 7–232)

## 2011-05-12 ENCOUNTER — Encounter: Payer: Self-pay | Admitting: Internal Medicine

## 2011-05-20 ENCOUNTER — Telehealth: Payer: Self-pay

## 2011-05-20 NOTE — Telephone Encounter (Signed)
Mailed to pt

## 2011-05-20 NOTE — Telephone Encounter (Signed)
Requesting copy of labs including CKMB.

## 2011-07-01 ENCOUNTER — Other Ambulatory Visit: Payer: Self-pay | Admitting: Internal Medicine

## 2011-08-01 ENCOUNTER — Other Ambulatory Visit: Payer: Self-pay | Admitting: *Deleted

## 2011-08-01 MED ORDER — ATORVASTATIN CALCIUM 10 MG PO TABS: 10.0000 mg | ORAL_TABLET | Freq: Every day | ORAL | Status: DC

## 2011-09-21 ENCOUNTER — Other Ambulatory Visit: Payer: Self-pay | Admitting: Internal Medicine

## 2012-05-27 ENCOUNTER — Other Ambulatory Visit: Payer: Self-pay

## 2012-05-27 MED ORDER — FINASTERIDE 1 MG PO TABS: 1.0000 mg | ORAL_TABLET | Freq: Every day | ORAL | Status: DC

## 2012-05-27 NOTE — Telephone Encounter (Signed)
Pt is requesting to pick up hardcopy of Propecia. Okay to print?

## 2012-05-27 NOTE — Telephone Encounter (Signed)
Patient notified of Rx ready to pick up

## 2012-05-31 ENCOUNTER — Other Ambulatory Visit: Payer: Self-pay | Admitting: Internal Medicine

## 2012-07-22 ENCOUNTER — Ambulatory Visit (INDEPENDENT_AMBULATORY_CARE_PROVIDER_SITE_OTHER): Payer: Medicare Other | Admitting: Internal Medicine

## 2012-07-22 ENCOUNTER — Ambulatory Visit (INDEPENDENT_AMBULATORY_CARE_PROVIDER_SITE_OTHER)
Admission: RE | Admit: 2012-07-22 | Discharge: 2012-07-22 | Disposition: A | Discharge status: Home / Self Care | Payer: Medicare Other | Source: Ambulatory Visit | Attending: Internal Medicine | Admitting: Internal Medicine

## 2012-07-22 ENCOUNTER — Other Ambulatory Visit (INDEPENDENT_AMBULATORY_CARE_PROVIDER_SITE_OTHER): Payer: Medicare Other

## 2012-07-22 ENCOUNTER — Encounter: Payer: Self-pay | Admitting: Internal Medicine

## 2012-07-22 VITALS — BP 160/88 | HR 75 | Temp 97.4°F | Resp 16 | Wt 163.0 lb

## 2012-07-22 DIAGNOSIS — Z0000 Encounter for general adult medical examination without abnormal findings: Secondary | ICD-10-CM

## 2012-07-22 DIAGNOSIS — H905 Unspecified sensorineural hearing loss: Secondary | ICD-10-CM

## 2012-07-22 DIAGNOSIS — E785 Hyperlipidemia, unspecified: Secondary | ICD-10-CM

## 2012-07-22 DIAGNOSIS — B009 Herpesviral infection, unspecified: Secondary | ICD-10-CM

## 2012-07-22 DIAGNOSIS — M5430 Sciatica, unspecified side: Secondary | ICD-10-CM

## 2012-07-22 DIAGNOSIS — Z23 Encounter for immunization: Secondary | ICD-10-CM

## 2012-07-22 DIAGNOSIS — K589 Irritable bowel syndrome without diarrhea: Secondary | ICD-10-CM

## 2012-07-22 LAB — HEPATIC FUNCTION PANEL
ALT: 25 U/L (ref 0–53)
AST: 24 U/L (ref 0–37)
Albumin: 4.3 g/dL (ref 3.5–5.2)
Total Protein: 7.4 g/dL (ref 6.0–8.3)

## 2012-07-22 LAB — COMPREHENSIVE METABOLIC PANEL
ALT: 25 U/L (ref 0–53)
CO2: 28 mEq/L (ref 19–32)
GFR: 96.1 mL/min (ref >60.00)
Sodium: 140 mEq/L (ref 135–145)
Total Bilirubin: 1 mg/dL (ref 0.3–1.2)
Total Protein: 7.4 g/dL (ref 6.0–8.3)

## 2012-07-22 LAB — LIPID PANEL
Cholesterol: 162 mg/dL (ref 0–200)
VLDL: 14 mg/dL (ref 0.0–40.0)

## 2012-07-22 MED ORDER — HYOSCYAMINE SULFATE 0.375 MG PO TB12: 0.3750 mg | ORAL_TABLET | Freq: Two times a day (BID) | ORAL | Status: DC | PRN

## 2012-07-22 MED ORDER — FINASTERIDE 1 MG PO TABS: 1.0000 mg | ORAL_TABLET | Freq: Every day | ORAL | Status: DC

## 2012-07-22 MED ORDER — TIMOLOL MALEATE 0.5 % OP SOLN: 1.0000 [drp] | Freq: Every day | OPHTHALMIC | Status: DC

## 2012-07-22 MED ORDER — TIMOLOL MALEATE 0.5 % OP SOLN: 1.0000 [drp] | Freq: Every day | OPHTHALMIC | Status: AC

## 2012-07-22 MED ORDER — ATORVASTATIN CALCIUM 10 MG PO TABS: 10.0000 mg | ORAL_TABLET | Freq: Every day | ORAL | Status: DC

## 2012-07-22 MED ORDER — LORAZEPAM 1 MG PO TABS: 1.0000 mg | ORAL_TABLET | Freq: Every day | ORAL | Status: DC | PRN

## 2012-07-22 MED ORDER — LOSARTAN POTASSIUM 50 MG PO TABS
50.0000 mg | ORAL_TABLET | Freq: Every day | ORAL | Status: DC
Start: 2012-07-22 — End: 2013-07-28

## 2012-07-22 MED ORDER — VALACYCLOVIR HCL 500 MG PO TABS: 500.0000 mg | ORAL_TABLET | Freq: Two times a day (BID) | ORAL | Status: DC

## 2012-07-22 MED ORDER — LOSARTAN POTASSIUM 50 MG PO TABS: 50.0000 mg | ORAL_TABLET | Freq: Every day | ORAL | Status: DC

## 2012-07-22 NOTE — Progress Notes (Signed)
Subjective:    Patient ID: Timothy Savage, male    DOB: 01-27-1941, 71 y.o.   MRN: [id number]  HPI The patient is here for annual Medicare wellness examination and management of other chronic and acute problems.  Had a bout of sciatica - mostly right. He took aleve with some help. He did do some stretching exercise that also wasn't very helpful. He did work through it. Reviewed chart - no lumbar spine imaging. He continues to have some discomfort, especially doing push-ups.   Needs pneumonia vaccine.   The risk factors are reflected in the social history.  The roster of all physicians providing medical care to patient - is listed in the Snapshot section of the chart.  Activities of daily living:  The patient is 100% inedpendent in all ADLs: dressing, toileting, feeding as well as independent mobility  Home safety : The patient has smoke detectors in the home. Fall- needs fall survey. They wear seatbelts. firearms are present in the home, kept in a safe fashion. There is no violence in the home.   There is no risks for hepatitis, STDs or HIV. There is no   history of blood transfusion. They have no travel history to infectious disease endemic areas of the world.  The patient has seen their dentist in the last six month. They have seen their eye doctor in the last year. They deny any progress hearing loss right ear and have not had audiologic testing in the last year.    They do not  have excessive sun exposure. Discussed the need for sun protection: hats, long sleeves and use of sunscreen if there is significant sun exposure.   Diet: the importance of a healthy diet is discussed. They do have a healthy diet.  The patient has a regular exercise program: kyaking, weight machines, walking , 45 min duration, 3-4 per week.  The benefits of regular aerobic exercise were discussed.  Depression screen: there are no signs or vegative symptoms of depression- irritability, change in appetite,  anhedonia, sadness/tearfullness.  Cognitive assessment: the patient manages all their financial and personal affairs and is actively engaged.   During the course of the visit the patient was educated and counseled about appropriate screening and preventive services including : fall prevention , diabetes screening, nutrition counseling, colorectal cancer screening, and recommended immunizations.  Past Medical History  Diagnosis Date  . ARTHROSCOPY, LEFT KNEE, HX OF 02/23/2008  . ASTHMA, CHILDHOOD 02/23/2008  . HEADACHES, HX OF 02/23/2008  . HERPES SIMPLEX INFECTION, TYPE I 02/23/2008  . HYPERLIPIDEMIA 02/23/2008  . Irritable bowel syndrome 02/23/2008  . NECK PAIN 04/22/2008  . Sciatica 04/22/2008  . SENSORINEURAL HEARING LOSS UNILATERAL 05/04/2010   Past Surgical History  Procedure Date  . Left knee arthroscopy   . Wrist surgery   . Vocal cord removed   . Nasal spur excised    Family History  Problem Relation Age of Onset  . Diabetes Mother   . Hypertension Father   . Coronary artery disease Father   . Colon cancer Paternal Grandmother    History   Social History  . Marital Status: Married    Spouse Name: N/A    Number of Children: N/A  . Years of Education: N/A   Occupational History  . Not on file.   Social History Main Topics  . Smoking status: Former Smoker    Types: Cigarettes    Quit date: 11/05/1976  . Smokeless tobacco: Not on file  . Alcohol Use:  Not on file  . Drug Use: Not on file  . Sexually Active: Not on file   Other Topics Concern  . 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 Married-"78, 2 sons- '81, '87. Retired    Current Outpatient Prescriptions on File Prior to Visit  Medication Sig Dispense Refill  . ascorbic Acid (VITAMIN C) 500 MG CPCR Take 500 mg by mouth daily.        . Cholecalciferol (KP VITAMIN D) 1000 UNITS capsule Take 1,000 Units by mouth daily.         . Omega-3 Fatty Acids (FISH OIL) 1000 MG CAPS Take by mouth daily.        Marland Kitchen PROPECIA 1 MG tablet TAKE ONE (1) TABLET(S) ONCE DAILY  90 tablet  0  . DISCONTD: atorvastatin (LIPITOR) 10 MG tablet Take 1 tablet (10 mg total) by mouth daily.  90 tablet  3  . DISCONTD: hyoscyamine (LEVBID) 0.375 MG 12 hr tablet Take 1 tablet (0.375 mg total) by mouth 2 (two) times daily as needed.  180 tablet  3  . DISCONTD: losartan (COZAAR) 50 MG tablet Take 1 tablet (50 mg total) by mouth daily.  90 tablet  3     Review of Systems Constitutional:  Negative for fever, chills, activity change and unexpected weight change.  HEENT:  Negative for hearing loss, ear pain, congestion, neck stiffness and postnasal drip. Negative for sore throat or swallowing problems. Negative for dental complaints.   Eyes: Negative for vision loss or change in visual acuity.  Respiratory: Negative for chest tightness and wheezing. Negative for DOE.   Cardiovascular: Negative for chest pain or palpitations. No decreased exercise tolerance Gastrointestinal: No change in bowel habit. No bloating or gas. No reflux or indigestion Genitourinary: Negative for urgency, frequency, flank pain and difficulty urinating.  Musculoskeletal: Negative for myalgias, back pain, arthralgias and gait problem.  Neurological: Negative for dizziness, tremors, weakness and headaches.  Hematological: Negative for adenopathy.  Psychiatric/Behavioral: Negative for behavioral problems and dysphoric mood.      Objective:   Physical Exam Filed Vitals:   07/22/12 0917  BP: 160/88  Pulse: 75  Temp: 97.4 F (36.3 C)  Resp: 16   Wt Readings from Last 3 Encounters:  07/22/12 163 lb (73.936 kg)  05/09/11 167 lb (75.751 kg)  05/04/10 166 lb (75.297 kg)   Gen'l: Well nourished well developed white male in no acute distress  HEENT: Head: Normocephalic and atraumatic. Right Ear: External ear normal. EAC/TM nl. Left Ear: External ear normal.  EAC/TM nl. Nose:  Nose normal. Mouth/Throat: Oropharynx is clear and moist. Dentition - native, in good repair. No buccal or palatal lesions. Posterior pharynx clear. Eyes: Conjunctivae and sclera clear. EOM intact. Pupils are equal, round, and reactive to light. Right eye exhibits no discharge. Left eye exhibits no discharge. Neck: Normal range of motion. Neck supple. No JVD present. No tracheal deviation present. No thyromegaly present.  Cardiovascular: Normal rate, regular rhythm, no gallop, no friction rub, no murmur heard.      Quiet precordium. 2+ radial and DP pulses . No carotid bruits Pulmonary/Chest: Effort normal. No respiratory distress or increased WOB, no wheezes, no rales. Pectus excavatum noted,  no  CVAT. Abdomen: Soft. Bowel sounds are normal in all quadrants. He exhibits no distension, no tenderness, no rebound or guarding, No heptosplenomegaly  Genitourinary: deferred to age and previously normal PSAs  Musculoskeletal: Normal range of motion. He exhibits no edema and no tenderness.       Small and large joints without redness, synovial thickening or deformity. Full range of motion preserved about all small, median and large joints.  Lymphadenopathy:    He has no cervical or supraclavicular adenopathy.  Neurological: He is alert and oriented to person, place, and time. CN II-XII intact. DTRs 2+ and symmetrical biceps, radial and patellar tendons. Cerebellar function normal with no tremor, rigidity, normal gait and station.  Skin: Skin is warm and dry. No rash noted. No erythema.  Psychiatric: He has a normal mood and affect. His behavior is normal. Thought content normal.   Lab Results  Component Value Date   WBC 7.2 05/02/2009   HGB 13.9 05/02/2009   HCT 40.9 05/02/2009   PLT 228.0 05/02/2009   GLUCOSE 108* 07/22/2012   CHOL 162 07/22/2012   TRIG 70.0 07/22/2012   HDL 60.10 07/22/2012   LDLCALC 88 07/22/2012        ALT 25 07/22/2012   AST 24 07/22/2012        NA 140 07/22/2012   K 4.7 07/22/2012     CL 105 07/22/2012   CREATININE 0.8 07/22/2012   BUN 20 07/22/2012   CO2 28 07/22/2012   TSH 0.85 05/04/2010   PSA 0.09* 05/09/2011         Assessment & Plan:

## 2012-07-22 NOTE — Patient Instructions (Addendum)
Normal exam. We will check your spine for any mechanical defects. You will get a full report and labs and x-ray report.

## 2012-07-25 NOTE — Assessment & Plan Note (Signed)
Interval history negative for serious illness, surgery or injury. Physical exam is normal. He is current with colorectal cancer screening, due for follow-up exam in 2015. Discussed pros and cons of prostate cancer screening (USPHCTF recommendations reviewed and ACU April '13 recommendations) and he has aged out for further evaluation at this time.Immunizations are up to date.  In summary - a very nice man who appears to be medically stable. He does need to monitor BP at home and report back. Suspect he has "white coat" hypertension. He will return as needed or in one year.

## 2012-07-25 NOTE — Assessment & Plan Note (Signed)
Stable w/o severe bouts of pain or altered bowel habit.

## 2012-07-25 NOTE — Assessment & Plan Note (Signed)
Plain x-ray w/o evidence of surgically significant disk or arthritic disease. His symptoms have abated.  Plan  Back/sciatica related stretch and exercise - recommended going to [employer] for instructional videos

## 2012-07-25 NOTE — Assessment & Plan Note (Addendum)
Lipid panel with LDL that is close to goal of 80 or less for patient at high risk for CAD. HDL is better than goal of 60. LDL/HDL ratio is favorable. NCEP/Framingham risk calculation: 17% chance of cardiac event in the next 10 years - moderate risk. Key factors raising risk in this calculation is systolic BP and age. Substituting SBP of 120 lowers risk to 12%  Plan Continue lipitor  Monitor BP at home and report back if running SBP > 140 on a consistent basis  Continue exercise program.

## 2012-07-25 NOTE — Assessment & Plan Note (Signed)
Stable   Plan - valtrex as needed for fever blisters

## 2012-07-25 NOTE — Assessment & Plan Note (Signed)
Stable without progressive hearing loss.

## 2012-08-07 ENCOUNTER — Encounter: Payer: Self-pay | Admitting: Internal Medicine

## 2012-12-17 ENCOUNTER — Encounter: Payer: Self-pay | Admitting: Internal Medicine

## 2013-07-14 ENCOUNTER — Other Ambulatory Visit: Payer: Self-pay | Admitting: Internal Medicine

## 2013-07-28 ENCOUNTER — Encounter: Payer: Self-pay | Admitting: Family Medicine

## 2013-07-28 ENCOUNTER — Encounter (HOSPITAL_COMMUNITY): Payer: Self-pay | Admitting: Physical Medicine and Rehabilitation

## 2013-07-28 ENCOUNTER — Emergency Department (HOSPITAL_COMMUNITY)
Admission: EM | Admit: 2013-07-28 | Discharge: 2013-07-28 | Disposition: A | Discharge status: Home / Self Care | Payer: Medicare Other | Attending: Emergency Medicine | Admitting: Emergency Medicine

## 2013-07-28 ENCOUNTER — Ambulatory Visit (INDEPENDENT_AMBULATORY_CARE_PROVIDER_SITE_OTHER): Payer: Medicare Other | Admitting: Family Medicine

## 2013-07-28 ENCOUNTER — Emergency Department (HOSPITAL_COMMUNITY): Payer: Medicare Other

## 2013-07-28 ENCOUNTER — Telehealth: Payer: Self-pay | Admitting: Internal Medicine

## 2013-07-28 VITALS — BP 144/82 | HR 110 | Temp 99.7°F | Wt 163.0 lb

## 2013-07-28 DIAGNOSIS — E785 Hyperlipidemia, unspecified: Secondary | ICD-10-CM | POA: Insufficient documentation

## 2013-07-28 DIAGNOSIS — R51 Headache: Secondary | ICD-10-CM

## 2013-07-28 DIAGNOSIS — J189 Pneumonia, unspecified organism: Secondary | ICD-10-CM

## 2013-07-28 DIAGNOSIS — Z8739 Personal history of other diseases of the musculoskeletal system and connective tissue: Secondary | ICD-10-CM | POA: Insufficient documentation

## 2013-07-28 DIAGNOSIS — R509 Fever, unspecified: Secondary | ICD-10-CM

## 2013-07-28 DIAGNOSIS — J45909 Unspecified asthma, uncomplicated: Secondary | ICD-10-CM | POA: Insufficient documentation

## 2013-07-28 DIAGNOSIS — Z87891 Personal history of nicotine dependence: Secondary | ICD-10-CM | POA: Insufficient documentation

## 2013-07-28 DIAGNOSIS — J159 Unspecified bacterial pneumonia: Secondary | ICD-10-CM | POA: Insufficient documentation

## 2013-07-28 DIAGNOSIS — R110 Nausea: Secondary | ICD-10-CM | POA: Insufficient documentation

## 2013-07-28 DIAGNOSIS — Z8719 Personal history of other diseases of the digestive system: Secondary | ICD-10-CM | POA: Insufficient documentation

## 2013-07-28 DIAGNOSIS — Z8669 Personal history of other diseases of the nervous system and sense organs: Secondary | ICD-10-CM | POA: Insufficient documentation

## 2013-07-28 DIAGNOSIS — R519 Headache, unspecified: Secondary | ICD-10-CM

## 2013-07-28 DIAGNOSIS — E860 Dehydration: Secondary | ICD-10-CM | POA: Insufficient documentation

## 2013-07-28 DIAGNOSIS — Z8619 Personal history of other infectious and parasitic diseases: Secondary | ICD-10-CM | POA: Insufficient documentation

## 2013-07-28 DIAGNOSIS — Z79899 Other long term (current) drug therapy: Secondary | ICD-10-CM | POA: Insufficient documentation

## 2013-07-28 LAB — URINALYSIS, ROUTINE W REFLEX MICROSCOPIC
Bilirubin Urine: NEGATIVE
Glucose, UA: NEGATIVE mg/dL
Ketones, ur: 15 mg/dL — AB
Nitrite: NEGATIVE
Protein, ur: NEGATIVE mg/dL
Specific Gravity, Urine: 1.017 (ref 1.005–1.030)
Urobilinogen, UA: 0.2 mg/dL (ref 0.0–1.0)

## 2013-07-28 LAB — URINE MICROSCOPIC-ADD ON

## 2013-07-28 LAB — COMPREHENSIVE METABOLIC PANEL
ALT: 26 U/L (ref 0–53)
AST: 28 U/L (ref 0–37)
Albumin: 3.7 g/dL (ref 3.5–5.2)
Chloride: 105 mEq/L (ref 96–112)
Creatinine, Ser: 0.89 mg/dL (ref 0.50–1.35)
Sodium: 142 mEq/L (ref 135–145)
Total Bilirubin: 0.5 mg/dL (ref 0.3–1.2)

## 2013-07-28 LAB — CBC WITH DIFFERENTIAL/PLATELET
Basophils Absolute: 0 10*3/uL (ref 0.0–0.1)
Basophils Relative: 0 % (ref 0–1)
Eosinophils Absolute: 0 10*3/uL (ref 0.0–0.7)
HCT: 40.9 % (ref 39.0–52.0)
Lymphocytes Relative: 18 % (ref 12–46)
MCH: 33 pg (ref 26.0–34.0)
MCHC: 34.2 g/dL (ref 30.0–36.0)
Monocytes Absolute: 0.8 10*3/uL (ref 0.1–1.0)
Neutro Abs: 4.3 10*3/uL (ref 1.7–7.7)
Neutrophils Relative %: 68 % (ref 43–77)
Platelets: 187 10*3/uL (ref 150–400)
RDW: 13.9 % (ref 11.5–15.5)
WBC: 6.3 10*3/uL (ref 4.0–10.5)

## 2013-07-28 MED ORDER — KETOROLAC TROMETHAMINE 30 MG/ML IJ SOLN
30.0000 mg | Freq: Once | INTRAMUSCULAR | Status: AC
  Administered 2013-07-28: 30 mg via INTRAVENOUS
  Filled 2013-07-28: qty 1

## 2013-07-28 MED ORDER — ACETAMINOPHEN 500 MG PO TABS
1000.0000 mg | ORAL_TABLET | Freq: Once | ORAL | Status: AC
  Administered 2013-07-28: 1000 mg via ORAL
  Filled 2013-07-28: qty 2

## 2013-07-28 MED ORDER — METOCLOPRAMIDE HCL 5 MG/ML IJ SOLN
10.0000 mg | Freq: Once | INTRAMUSCULAR | Status: AC
  Administered 2013-07-28: 10 mg via INTRAVENOUS
  Filled 2013-07-28: qty 2

## 2013-07-28 MED ORDER — SODIUM CHLORIDE 0.9 % IV BOLUS (SEPSIS): 1000.0000 mL | Freq: Once | INTRAVENOUS | Status: DC

## 2013-07-28 MED ORDER — SODIUM CHLORIDE 0.9 % IV BOLUS (SEPSIS)
1000.0000 mL | Freq: Once | INTRAVENOUS | Status: AC
  Administered 2013-07-28: 1000 mL via INTRAVENOUS

## 2013-07-28 MED ORDER — PROCHLORPERAZINE EDISYLATE 5 MG/ML IJ SOLN
10.0000 mg | Freq: Once | INTRAMUSCULAR | Status: AC
  Administered 2013-07-28: 10 mg via INTRAVENOUS
  Filled 2013-07-28: qty 2

## 2013-07-28 MED ORDER — DEXAMETHASONE SODIUM PHOSPHATE 10 MG/ML IJ SOLN
10.0000 mg | Freq: Once | INTRAMUSCULAR | Status: AC
  Administered 2013-07-28: 10 mg via INTRAVENOUS
  Filled 2013-07-28: qty 1

## 2013-07-28 MED ORDER — DIPHENHYDRAMINE HCL 50 MG/ML IJ SOLN
25.0000 mg | Freq: Once | INTRAMUSCULAR | Status: AC
  Administered 2013-07-28: 25 mg via INTRAVENOUS
  Filled 2013-07-28: qty 1

## 2013-07-28 MED ORDER — AZITHROMYCIN 250 MG PO TABS: ORAL_TABLET | ORAL | Status: DC

## 2013-07-28 MED ORDER — LEVOFLOXACIN 750 MG PO TABS: 750.0000 mg | ORAL_TABLET | Freq: Every day | ORAL | Status: DC

## 2013-07-28 NOTE — ED Provider Notes (Signed)
CSN: [id number]     Arrival date & time 07/28/13  1317 History   First MD Initiated Contact with Patient 07/28/13 1603     Chief Complaint  Patient presents with  . Weakness  . Headache   (Consider location/radiation/quality/duration/timing/severity/associated sxs/prior Treatment) The history is provided by the patient.   72 year old male here as a referral from his primary care doctor's office for further evaluation of a headache. Patient reports that his symptoms began on Saturday. He states that he had diffuse myalgias, generalized weakness, rigors and fevers during that time. He thinks he may have had the flu. He reports fevers to 103 on Sunday Monday and 101 on Tuesday. He experienced some mild nausea, but no vomiting. Throughout this episode he has had a pulsating, intermittent right posterior temporal headache. He does have periods of time where he is pain-free. He denies jaw pain, vision changes, eye pain, ear pain, tinnitus, neck pain, neck stiffness, dysuria/frequency, difficulty swallowing, chest pain, cough, abdominal pain, diarrhea, rash, travel, tick bites, known sick exposures, trauma.  He tried ibuprofen, Tylenol, Aleve and aspirin. He reports these medications helped with his other symptoms, but did not help with his headache.  Past Medical History  Diagnosis Date  . ARTHROSCOPY, LEFT KNEE, HX OF 02/23/2008  . ASTHMA, CHILDHOOD 02/23/2008  . HEADACHES, HX OF 02/23/2008  . HERPES SIMPLEX INFECTION, TYPE I 02/23/2008  . HYPERLIPIDEMIA 02/23/2008  . Irritable bowel syndrome 02/23/2008  . NECK PAIN 04/22/2008  . Sciatica 04/22/2008  . SENSORINEURAL HEARING LOSS UNILATERAL 05/04/2010   Past Surgical History  Procedure Laterality Date  . Left knee arthroscopy      Dr. Cleophas Dunker  . Wrist surgery  '02    left wrist surgery: [facility] for severe fracture  [facility]    granuloma excised  . Nasal spur excised  80's  (Patesovorus)   Family History   Problem Relation Age of Onset  . Diabetes Mother   . Hypertension Father   . Coronary artery disease Father   . Heart disease Father     CAD/MI.  Marland Kitchen Colon cancer Paternal Grandmother   . Cancer Neg Hx   . COPD Neg Hx    History  Substance Use Topics  . Smoking status: Former Smoker    Types: Cigarettes    Quit date: 11/05/1976  . Smokeless tobacco: Never Used  . Alcohol Use: Yes     Comment: occ    Review of Systems  Constitutional: Positive for fever and chills.  HENT: Negative for neck pain and neck stiffness.   Eyes: Negative for photophobia and pain.  Respiratory: Negative for cough, chest tightness and shortness of breath.   Cardiovascular: Negative for chest pain and palpitations.  Gastrointestinal: Positive for nausea. Negative for vomiting, abdominal pain and diarrhea.  Endocrine: Negative for polydipsia and polyphagia.  Genitourinary: Negative for dysuria and frequency.  Musculoskeletal: Positive for myalgias. Negative for back pain and joint swelling.  Skin: Negative for rash.  Neurological: Negative for light-headedness and headaches.  Hematological: Negative for adenopathy. Does not bruise/bleed easily.  All other systems reviewed and are negative.    Allergies  Review of patient's allergies indicates no known allergies.  Home Medications   Current Outpatient Rx  Name  Route  Sig  Dispense  Refill  . acetaminophen (TYLENOL) 500 MG tablet   Oral   Take 500 mg by mouth every 6 (six) hours as needed for pain or fever.         Marland Kitchen  ascorbic Acid (VITAMIN C) 500 MG CPCR   Oral   Take 500 mg by mouth daily.          Marland Kitchen aspirin 325 MG tablet   Oral   Take 325 mg by mouth every 4 (four) hours as needed for pain or fever.         Marland Kitchen atorvastatin (LIPITOR) 10 MG tablet   Oral   Take 10 mg by mouth daily.         . Cholecalciferol (KP VITAMIN D) 1000 UNITS capsule   Oral   Take 1,000 Units by mouth daily.           . finasteride (PROPECIA) 1 MG  tablet   Oral   Take 1 tablet (1 mg total) by mouth daily.   90 tablet   3   . ibuprofen (ADVIL,MOTRIN) 200 MG tablet   Oral   Take 200 mg by mouth every 6 (six) hours as needed for pain, fever or headache.         Marland Kitchen LORazepam (ATIVAN) 1 MG tablet   Oral   Take 1 mg by mouth every 6 (six) hours as needed for anxiety (sleep).         . losartan (COZAAR) 50 MG tablet   Oral   Take 1 tablet (50 mg total) by mouth daily.   90 tablet   3   . Omega-3 Fatty Acids (FISH OIL) 1000 MG CAPS   Oral   Take by mouth daily.           . timolol (TIMOPTIC) 0.5 % ophthalmic solution   Both Eyes   Place 1 drop into both eyes daily.   10 mL   3   . valACYclovir (VALTREX) 500 MG tablet   Oral   Take 1 tablet (500 mg total) by mouth 2 (two) times daily. Take 2 tablets once daily as needed   60 tablet   3    BP 155/87  Pulse 87  Temp(Src) 98.8 F (37.1 C) (Oral)  Resp 18  Ht 5\' 10"  (1.778 m)  Wt 163 lb 3.2 oz (74.027 kg)  BMI 23.42 kg/m2  SpO2 98% Physical Exam  Vitals reviewed. Constitutional: He is oriented to person, place, and time. He appears well-developed and well-nourished. No distress.  HENT:  Head: Normocephalic.  Right Ear: External ear normal.  Left Ear: External ear normal.  Nose: Nose normal.  Mouth/Throat: Oropharynx is clear and moist. No oropharyngeal exudate.  Mucous membranes dry  Eyes: Conjunctivae and EOM are normal. Pupils are equal, round, and reactive to light.  Neck: Normal range of motion. Neck supple. No spinous process tenderness present. No Brudzinski's sign and no Kernig's sign noted.  Cardiovascular: Normal rate, regular rhythm, normal heart sounds and intact distal pulses.  Exam reveals no gallop and no friction rub.   No murmur heard. Pulmonary/Chest: Effort normal and breath sounds normal.  Abdominal: Soft. Bowel sounds are normal. He exhibits no distension. There is no tenderness.  Musculoskeletal: Normal range of motion. He exhibits no  edema and no tenderness.  Neurological: He is alert and oriented to person, place, and time. He has normal strength. No cranial nerve deficit or sensory deficit. Gait normal.  Skin: Skin is warm and dry.  Psychiatric: He has a normal mood and affect.    ED Course  Procedures (including critical care time) Labs Review Labs Reviewed  CBC WITH DIFFERENTIAL - Abnormal; Notable for the following:    Monocytes Relative  13 (*)    All other components within normal limits  COMPREHENSIVE METABOLIC PANEL - Abnormal; Notable for the following:    Glucose, Bld 109 (*)    GFR calc non Af Amer 85 (*)    All other components within normal limits  URINALYSIS, ROUTINE W REFLEX MICROSCOPIC - Abnormal; Notable for the following:    Hgb urine dipstick SMALL (*)    Ketones, ur 15 (*)    All other components within normal limits  URINE MICROSCOPIC-ADD ON   Imaging Review Dg Chest 2 View  07/28/2013   CLINICAL DATA:  Headache  EXAM: CHEST  2 VIEW  COMPARISON:  None  FINDINGS: Normal heart size. No pleural effusion or edema. There is asymmetric airspace consolidation within the right upper lobe. Left lung is clear. Within the mid thoracic spine there is a compression fracture. By report, this was present on exam from 09/09/2001.  IMPRESSION: 1. Right upper lobe pneumonia.   Electronically Signed   By: Signa Kell M.D.   On: 07/28/2013 20:48   Ct Head Wo Contrast  07/28/2013   CLINICAL DATA:  Headache with fever  EXAM: CT HEAD WITHOUT CONTRAST  TECHNIQUE: Contiguous axial images were obtained from the base of the skull through the vertex without intravenous contrast. Study was obtained within 24 hr of patient's arrival at the emergency department.  COMPARISON:  brain MRI May 15, 2010  FINDINGS: The ventricles are normal in size and configuration. There is no mass, hemorrhage, extra-axial fluid collection, or midline shift. Gray-white compartments are normal. There is no demonstrable acute infarct. Bony  calvarium appears intact. The mastoid air cells are clear.  IMPRESSION: Study within normal limits.   Electronically Signed   By: Bretta Bang   On: 07/28/2013 18:19    MDM   72 year old male with no significant past medical history that presents with complaint of headache in the setting of 5 days of flulike symptoms. No alarm features regarding the headache. The headache was not thunderclap in onset. No meningismus. No vision changes. No vomiting. No neck stiffness. He is afebrile, mildly hypertensive, otherwise well-appearing. Normal neurologic exam.  Diff Dx: Dehydration, Viral illness, Migraine, Meningitis, Glaucoma, Temporal Arteritis, Tension HA, Hypertensive Emergency, Cluster HA, stress.  Doubt Glaucoma as no vision changes, visual fields intact to confrontation, no halos.  Doubt Meningitis/Encephalitis as no fevers, no neck stiffness, no meningeal signs.  Doubt Temporal Arteritis as no temporal tenderness, no jaw pain, no vision changes.  Pt's BP well controlled so not HE.  Reglan, IVFs, CBC, CMP, UA, CT head.  7:50 PM Workup reassuring thus far, but Mr Talerico became febrile again.  CXR ordered.  He reports his HA is slightly improved, but is still present.  Compazine, Toradol, benadryl, decadron.  9:20 PM CXR with RUL PNA.  Rx for levofloxacin.  The remainder of his workup was reassuring.  Pt reports his HA is gone and that he feels much better.  It is felt the pt is stable for discharge with PCP f/u.  Return precautions reviewed.  Clinical Impression: 1. Community acquired pneumonia   2. Headache   3. Dehydration     Disposition: Discharge  Condition: Good  I have discussed the results, Dx and Tx plan. They expressed understanding and agree with the plan and were told to return to ED with any worsening of condition or concern.    Discharge Medication List as of 07/28/2013  9:09 PM    START taking these medications   Details  levofloxacin (LEVAQUIN) 750 MG tablet Take 1  tablet (750 mg total) by mouth daily. X 7 days, Starting 07/28/2013, Until Discontinued, Print        Follow Up: Jacques Navy, MD [address] [place] [address] [phone]  Schedule an appointment as soon as possible for a visit    Pt seen in conjunction with Dr. Hyacinth Meeker.  Reine Just. Beverely Pace, MD Emergency Medicine PGY-III [phone]    Oleh Genin, MD 07/29/13 [phone]

## 2013-07-28 NOTE — ED Notes (Addendum)
Pt presents to department for evaluation of headache x4 days. States he has been very weak over the last several days. Also states cough and fever. 8/10 pulsating headache at the time, becomes worse with movement. No nausea/vomiting. Pt is conscious alert and oriented x4. Able to move all extremities. No neurological deficits noted. Ambulatory to exam room.

## 2013-07-28 NOTE — Progress Notes (Signed)
  Subjective:    Patient ID: Timothy Savage, male    DOB: Jul 02, 1941, 72 y.o.   MRN: [id number]  HPI Here for a cluster of symptoms that started 6 days ago. He had the sudden onset of diffuse muscle aches over the body and fever as high as 103 degrees. He felt very weak and shaky, and he has been nauseated the whole time. He has not vomited. He had a mild dry cough and a mild ST. Then 5 days ago he developed a sharp pulsating HA in the back of the head behind the right ear. No ear pain. Most of his sx have improved in the past few days, the fever has come down and the muscle aches are better. However the HA persists and in fact it has gotten worse. Today the HAs are up from a 6 out of 10 in intensity to a 9 out of 10. He has sharp pulses of pain every 15 seconds. The pain comes whenever he coughs or swallows or moves his head. No vision changes or neck pain. No neurologic deficits.    Review of Systems  Constitutional: Positive for fever, chills and fatigue.  HENT: Negative for congestion, neck pain, neck stiffness, postnasal drip and sinus pressure.   Eyes: Negative.   Respiratory: Negative.   Gastrointestinal: Positive for nausea. Negative for vomiting, abdominal pain, diarrhea, constipation, blood in stool and abdominal distention.  Musculoskeletal: Positive for myalgias. Negative for joint swelling.  Skin: Negative.   Neurological: Positive for headaches. Negative for dizziness, tremors, seizures, syncope, facial asymmetry, speech difficulty, weakness, light-headedness and numbness.       Objective:   Physical Exam  Constitutional: He is oriented to person, place, and time.  Alert but in pain. He winces with pain every 15 seconds or so. No photophobia.   HENT:  Head: Normocephalic and atraumatic.  Right Ear: External ear normal.  Left Ear: External ear normal.  Nose: Nose normal.  Mouth/Throat: Oropharynx is clear and moist.  He is dehydrated   Eyes: Conjunctivae and EOM are normal.  Pupils are equal, round, and reactive to light.  Neck: Neck supple.  Cardiovascular: Normal rate, regular rhythm, normal heart sounds and intact distal pulses.   Pulmonary/Chest: Effort normal and breath sounds normal. No respiratory distress. He has no wheezes. He has no rales.  Lymphadenopathy:    He has no cervical adenopathy.  Neurological: He is alert and oriented to person, place, and time. He has normal reflexes. No cranial nerve deficit. He exhibits normal muscle tone. Coordination normal.          Assessment & Plan:  Although some of his sx are improving I am concerned about this HA that is actually worsening. He needs IV fluids and diagnostic labs. He probably needs a head CT as well. His wife will drive him to [facility] ER immediately.

## 2013-07-28 NOTE — ED Notes (Addendum)
Saturday pt had gradual onset of severe throbbing HA to right side of head that has been intermittent and increasing in intensity since then. Pt had severe flu like symptoms Monday with temp of 103 at home with decreased appetite. Pt denies hx of HA. Denies visual changes, denies weakness, denies trauma to head, denies numbness or tingling. Denies neck pain.

## 2013-07-28 NOTE — Telephone Encounter (Signed)
Patient Information:  Caller Name: Zahir  Phone: [phone]  Patient: Timothy Savage, Timothy Savage  Gender: Male  DOB: [birth year]  Age: 72 Years  PCP: Illene Regulus (Adults only)  Office Follow Up:  Does the office need to follow up with this patient?: No  Instructions For The Office: N/A  RN Note:  Oral temp 99 at 0630. Drinking fluids and voiding dark urine. Cautioned not to use ASA for influenza-like symptoms. Hydrate and humidify to loosed cough and improved hydration (ie colored urine.)  No appointments at [facility] office until 1800; scheduled at [facility] for 1130 07/28/13 with Dr Clent Ridges.    Symptoms  Reason For Call & Symptoms: Pulsating headache on right side of head and  behind right ear to  crown.  pain rated 5-6 /10.  Began with "flu like sympotms" with weaness, joint pain, fever, occasional productive (green phlem) cough, anorexia, headache and sore throat. Last fever 101 was 07/27/13.  Reviewed Health History In EMR: Yes  Reviewed Medications In EMR: Yes  Reviewed Allergies In EMR: Yes  Reviewed Surgeries / Procedures: Yes  Date of Onset of Symptoms: 07/24/2013  Treatments Tried: otc pain meds  Treatments Tried Worked: No  Guideline(s) Used:  Influenza - Seasonal  Headache  Disposition Per Guideline:   See Today in Office  Reason For Disposition Reached:   Fever present > 3 days (72 hours)  Advice Given:  Rest:   Lie down in a dark, quiet place and try to relax. Close your eyes and imagine your entire body relaxing.  Apply Cold to the Area:   Apply a cold wet washcloth or cold pack to the forehead for 20 minutes.  Call Back If:  You become worse.  Patient Will Follow Care Advice:  YES

## 2013-07-29 ENCOUNTER — Encounter: Payer: Self-pay | Admitting: Internal Medicine

## 2013-07-29 NOTE — ED Provider Notes (Signed)
Pt with several days of myalgias, followed by fever and headache today - HA is intermittent, q 15-30 seconds and then resolves spontaneously - sent from PMD for imaging adn w/u - no immunosuprression.  On my exam has normal neuro exam, normal HENT exam, soft abd and clear heart and lung exam.  Shows RUL infiltrate c/w possible pna, abx ordered, Ct head neg, stable for d/c.  Given fluids for rehydration.  Meds given in ED:  Medications  sodium chloride 0.9 % bolus 1,000 mL (0 mLs Intravenous Stopped 07/28/13 1732)  metoCLOPramide (REGLAN) injection 10 mg (10 mg Intravenous Given 07/28/13 1730)  sodium chloride 0.9 % bolus 1,000 mL (0 mLs Intravenous Stopped 07/28/13 1911)  ketorolac (TORADOL) 30 MG/ML injection 30 mg (30 mg Intravenous Given 07/28/13 1915)  prochlorperazine (COMPAZINE) injection 10 mg (10 mg Intravenous Given 07/28/13 1915)  dexamethasone (DECADRON) injection 10 mg (10 mg Intravenous Given 07/28/13 1915)  diphenhydrAMINE (BENADRYL) injection 25 mg (25 mg Intravenous Given 07/28/13 1916)  acetaminophen (TYLENOL) tablet 1,000 mg (1,000 mg Oral Given 07/28/13 2002)    Discharge Medication List as of 07/28/2013  9:09 PM    START taking these medications   Details  levofloxacin (LEVAQUIN) 750 MG tablet Take 1 tablet (750 mg total) by mouth daily. X 7 days, Starting 07/28/2013, Until Discontinued, Print       I saw and evaluated the patient, reviewed the resident's note and I agree with the findings and plan.      Vida Roller, MD 07/29/13 [phone]

## 2013-08-18 ENCOUNTER — Encounter: Payer: Self-pay | Admitting: Internal Medicine

## 2013-08-18 ENCOUNTER — Other Ambulatory Visit (INDEPENDENT_AMBULATORY_CARE_PROVIDER_SITE_OTHER): Payer: Medicare Other

## 2013-08-18 ENCOUNTER — Ambulatory Visit (INDEPENDENT_AMBULATORY_CARE_PROVIDER_SITE_OTHER): Payer: Medicare Other | Admitting: Internal Medicine

## 2013-08-18 VITALS — BP 168/98 | HR 67 | Temp 99.2°F | Ht 70.0 in | Wt 163.8 lb

## 2013-08-18 DIAGNOSIS — B009 Herpesviral infection, unspecified: Secondary | ICD-10-CM

## 2013-08-18 DIAGNOSIS — R739 Hyperglycemia, unspecified: Secondary | ICD-10-CM

## 2013-08-18 DIAGNOSIS — K589 Irritable bowel syndrome without diarrhea: Secondary | ICD-10-CM

## 2013-08-18 DIAGNOSIS — Z23 Encounter for immunization: Secondary | ICD-10-CM

## 2013-08-18 DIAGNOSIS — Z0000 Encounter for general adult medical examination without abnormal findings: Secondary | ICD-10-CM

## 2013-08-18 DIAGNOSIS — R7309 Other abnormal glucose: Secondary | ICD-10-CM

## 2013-08-18 DIAGNOSIS — H905 Unspecified sensorineural hearing loss: Secondary | ICD-10-CM

## 2013-08-18 DIAGNOSIS — E785 Hyperlipidemia, unspecified: Secondary | ICD-10-CM

## 2013-08-18 LAB — HEMOGLOBIN A1C: Hgb A1c MFr Bld: 6.2 % (ref 4.6–6.5)

## 2013-08-18 LAB — LIPID PANEL
Cholesterol: 169 mg/dL (ref 0–200)
Total CHOL/HDL Ratio: 3
Triglycerides: 79 mg/dL (ref 0.0–149.0)

## 2013-08-18 MED ORDER — ATORVASTATIN CALCIUM 10 MG PO TABS: 10.0000 mg | ORAL_TABLET | Freq: Every day | ORAL | Status: DC

## 2013-08-18 MED ORDER — FINASTERIDE 1 MG PO TABS: 1.0000 mg | ORAL_TABLET | Freq: Every day | ORAL | Status: DC

## 2013-08-18 MED ORDER — VALACYCLOVIR HCL 500 MG PO TABS: 500.0000 mg | ORAL_TABLET | Freq: Two times a day (BID) | ORAL | Status: DC

## 2013-08-18 MED ORDER — LORAZEPAM 1 MG PO TABS: 1.0000 mg | ORAL_TABLET | Freq: Four times a day (QID) | ORAL | Status: DC | PRN

## 2013-08-18 MED ORDER — LOSARTAN POTASSIUM 50 MG PO TABS: 50.0000 mg | ORAL_TABLET | Freq: Every day | ORAL | Status: DC

## 2013-08-18 NOTE — Progress Notes (Signed)
Subjective:    Patient ID: Timothy Savage, male    DOB: 05-08-1941, 72 y.o.   MRN: [id number]  HPI The patient is here for annual Medicare wellness examination and management of other chronic and acute problems.  Interval history - Sept 24, '14 had severe HA which wound up with ED evaluation: normal CBCD, normal Cmet, CXR with RUL infiltrate and a T6 anterior wedge compression fracture. Treated with Levaquin 750mg   X 7 days. He did feel better after completing antibiotics. Question if this was influenza related pneumonia.    The risk factors are reflected in the social history.  The roster of all physicians providing medical care to patient - is listed in the Snapshot section of the chart.  Activities of daily living:  The patient is 100% inedpendent in all ADLs: dressing, toileting, feeding as well as independent mobility  Home safety : The patient has smoke detectors in the home. Falls - none. They wear seatbelts.  firearms are present in the home, kept in a safe fashion. There is no violence in the home.   There is no risks for hepatitis, STDs or HIV. There is no history of blood transfusion. They have no travel history to infectious disease endemic areas of the world.  The patient has  seen their dentist in the last six month. They have seen their eye doctor in the last year - followed for increased pressure (glaucoma). They deny any hearing difficulty in the last year but he has variable pitch perception since a hearing and have not had audiologic testing in the last year.    They do not  have excessive sun exposure. Discussed the need for sun protection: hats, long sleeves and use of sunscreen if there is significant sun exposure.   Diet: the importance of a healthy diet is discussed. They do have a healthy diet.  The patient has a regular exercise program: walking/kyaking/treadmill , duration, 30-60 per week.  The benefits of regular aerobic exercise were discussed.  Depression  screen: there are no signs or vegative symptoms of depression- irritability, change in appetite, anhedonia, sadness/tearfullness.  Cognitive assessment: the patient manages all their financial and personal affairs and is actively engaged. They could relate day,date,year and events; recalled 3/3 objects at 3 minutes; performed clock-face test normally.  The following portions of the patient's history were reviewed and updated as appropriate: allergies, current medications, past family history, past medical history,  past surgical history, past social history  and problem list.  Vision, hearing, body mass index were assessed and reviewed.   During the course of the visit the patient was educated and counseled about appropriate screening and preventive services including : fall prevention , diabetes screening, nutrition counseling, colorectal cancer screening, and recommended immunizations.  Past Medical History  Diagnosis Date  . ARTHROSCOPY, LEFT KNEE, HX OF 02/23/2008  . ASTHMA, CHILDHOOD 02/23/2008  . HEADACHES, HX OF 02/23/2008  . HERPES SIMPLEX INFECTION, TYPE I 02/23/2008  . HYPERLIPIDEMIA 02/23/2008  . Irritable bowel syndrome 02/23/2008  . NECK PAIN 04/22/2008  . Sciatica 04/22/2008  . SENSORINEURAL HEARING LOSS UNILATERAL 05/04/2010   Past Surgical History  Procedure Laterality Date  . Left knee arthroscopy      Dr. Cleophas Dunker  . Wrist surgery  '02    left wrist surgery[facility] ORIF for severe fracture  [facility]    granuloma excised  . Nasal spur excised  80's  (Patesovorus)   Family History  Problem Relation  Age of Onset  . Diabetes Mother   . Hypertension Father   . Coronary artery disease Father   . Heart disease Father     CAD/MI.  Marland Kitchen Colon cancer Paternal Grandmother   . Cancer Neg Hx   . COPD Neg Hx    History   Social History  . Marital Status: Married    Spouse Name: N/A    Number of Children: N/A  . Years of Education: 16    Occupational History  . Not on file.   Social History Main Topics  . Smoking status: Former Smoker    Types: Cigarettes    Quit date: 11/05/1976  . Smokeless tobacco: Never Used  . Alcohol Use: Yes     Comment: occ  . Drug Use: No  . Sexual Activity: Yes    Partners: Female   Other Topics Concern  . 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 Married-"78, 2 sons- '81, '87. Retired. ACP - Yes - for CPR and Intensive. no heroic or futile measures, i.e. Long term mechanical ventilation or artificial nutrition over the long term.     Current Outpatient Prescriptions on File Prior to Visit  Medication Sig Dispense Refill  . acetaminophen (TYLENOL) 500 MG tablet Take 500 mg by mouth every 6 (six) hours as needed for pain or fever.      Marland Kitchen ascorbic Acid (VITAMIN C) 500 MG CPCR Take 500 mg by mouth daily.       Marland Kitchen aspirin 325 MG tablet Take 325 mg by mouth every 4 (four) hours as needed for pain or fever.      . Cholecalciferol (KP VITAMIN D) 1000 UNITS capsule Take 1,000 Units by mouth daily.        Marland Kitchen ibuprofen (ADVIL,MOTRIN) 200 MG tablet Take 200 mg by mouth every 6 (six) hours as needed for pain, fever or headache.      . levofloxacin (LEVAQUIN) 750 MG tablet Take 1 tablet (750 mg total) by mouth daily. X 7 days  7 tablet  0  . Omega-3 Fatty Acids (FISH OIL) 1000 MG CAPS Take by mouth daily.        . timolol (TIMOPTIC) 0.5 % ophthalmic solution Place 1 drop into both eyes daily.  10 mL  3   No current facility-administered medications on file prior to visit.     Review of Systems Constitutional:  positive for fever, chills with acute influenza like illness. activity change and unexpected weight change but did loose 10 lbs with illness.  HEENT:  Negative forprogressive hearing loss, ear pain, congestion, neck stiffness and postnasal drip. Negative for sore throat or swallowing problems. Negative  for dental complaints.   Eyes: Negative for vision loss or change in visual acuity. Followed for increase pressure. Respiratory: Negative for chest tightness and wheezing. Negative for DOE.   Cardiovascular: Negative for chest pain or palpitations. No decreased exercise tolerance Gastrointestinal: No change in bowel habit. No bloating or gas. No reflux or indigestion Genitourinary: Negative for urgency, frequency, flank pain and difficulty urinating. Nocturia x 2, rarely 3.  Musculoskeletal: Negative for myalgias, back pain, arthralgias and gait problem. Will have shoulder, wrist and elbow pain, left knee pain.  Neurological: Negative for dizziness, tremors, weakness and headaches.  Hematological: Negative for adenopathy.  Psychiatric/Behavioral: Negative for behavioral problems and dysphoric mood.       Objective:   Physical Exam  Filed Vitals:   08/18/13 1325  BP: 168/98  Pulse: 67  Temp: 99.2 F (37.3 C)   Wt Readings from Last 3 Encounters:  08/18/13 163 lb 12.8 oz (74.299 kg)  07/28/13 163 lb 3.2 oz (74.027 kg)  07/28/13 163 lb (73.936 kg)   Gen'l: Well nourished well developed white  male in no acute distress  HEENT: Head: Normocephalic and atraumatic. Right Ear: External ear normal. EAC/TM nl. Left Ear: External ear normal.  EAC/TM nl. Nose: Nose normal. Mouth/Throat: Oropharynx is clear and moist. Dentition - native, in good repair. No buccal or palatal lesions. Posterior pharynx clear. Eyes: Conjunctivae and sclera clear. EOM intact. Pupils are equal, round, and reactive to light. Right eye exhibits no discharge. Left eye exhibits no discharge. Neck: Normal range of motion. Neck supple. No JVD present. No tracheal deviation present. No thyromegaly present.  Cardiovascular: Normal rate, regular rhythm, no gallop, no friction rub, no murmur heard.      Quiet precordium. 2+ radial and DP pulses . No carotid bruits Pulmonary/Chest: Effort normal. No respiratory distress or  increased WOB, no wheezes, no rales. No chest wall deformity or CVAT. Abdomen: Soft. Bowel sounds are normal in all quadrants. He exhibits no distension, no tenderness, no rebound or guarding, No heptosplenomegaly  Genitourinary:  deferred Musculoskeletal: Normal range of motion. He exhibits no edema and no tenderness.       Small and large joints without redness, synovial thickening or deformity. Full range of motion preserved about all small, median and large joints.  Lymphadenopathy:    He has no cervical or supraclavicular adenopathy.  Neurological: He is alert and oriented to person, place, and time. CN II-XII intact. DTRs 2+ and symmetrical biceps, radial and patellar tendons. Cerebellar function normal with no tremor, rigidity, normal gait and station.  Skin: Skin is warm and dry. No rash noted. No erythema.  Psychiatric: He has a normal mood and affect. His behavior is normal. Thought content normal.         Assessment & Plan:  Pneumonia - patient had symptoms c/w influenza then developed severe HA. Eval in ED not c/w bacterial pneumonia: no leukocytosis, no productive cough, no hypoxemia. He did have feint infiltrate and fever. Question if this was influenza. He completed Levaquin but still has mild cough and fatigue  Plan No additional antibiotics  If viral infection - he should continue to improve. For lack of resolution he should return for further evaluation.

## 2013-08-18 NOTE — Patient Instructions (Addendum)
Good to see you.  You are making a good recovery from whatever pneumonia you had - very possibly influenza related.  You are current and up todate with health maintenance but will be due colonoscopy after Jun 05 2014. You will need to return for a nurse visit in 2-3 weeks for Prevnar.  Hope to see you on [facility].

## 2013-08-19 NOTE — Assessment & Plan Note (Signed)
No recent outbreaks. 

## 2013-08-19 NOTE — Assessment & Plan Note (Signed)
Taking and tolerating medication. Lipid panel reveals good control: LDL 91, HDL 62 - better than goal. LFTs normal  Plan Continue present regimen

## 2013-08-19 NOTE — Assessment & Plan Note (Signed)
He has made a good recovery with minor deficit right ear with pitch perception.

## 2013-08-19 NOTE — Assessment & Plan Note (Signed)
No complaint of active symptoms - no bloating, irregular bowel habit.  Plan Continue diet management  Hyoscyamine prn

## 2013-08-19 NOTE — Assessment & Plan Note (Signed)
Interval history remarkable for respiratory infection-resolving but otherwise normal. Physical exam is normal. Lab results reviewed and in normal limits. He is current with CRC screening, due in August '15 for follow-up colonoscopy. He has aged out of prostate cancer screening and had normal PSA '08,'09, '10 and '12. Immunizations are up to date and he is given Prevnar 13 today.  In summary A very nice man who is medically stable and generally doing well. He will return in 1 year or sooner as needed.

## 2013-09-01 ENCOUNTER — Telehealth: Payer: Self-pay

## 2013-09-01 ENCOUNTER — Ambulatory Visit (INDEPENDENT_AMBULATORY_CARE_PROVIDER_SITE_OTHER): Payer: Medicare Other

## 2013-09-01 DIAGNOSIS — Z23 Encounter for immunization: Secondary | ICD-10-CM

## 2013-09-01 MED ORDER — FUROSEMIDE 20 MG PO TABS: 20.0000 mg | ORAL_TABLET | Freq: Every day | ORAL | Status: DC

## 2013-09-01 NOTE — Telephone Encounter (Signed)
Based on this reading and previous readings recommend adding low dose diuretic for more uniform control of blood pressure - furosemide 20 mg daily (Rx to pharmacy)

## 2013-09-01 NOTE — Telephone Encounter (Signed)
Patient came in for a nurse visit for his Prevnar injection. He asked that his blood pressure be taken also. I took it manually and it is 158/98. He took with his home monitor and it is 152/104. He states he took his blood pressure at home this morning and it was 124/83.

## 2013-09-01 NOTE — Telephone Encounter (Signed)
Left message on home and cell numbers to return call.

## 2013-09-02 ENCOUNTER — Telehealth: Payer: Self-pay | Admitting: Internal Medicine

## 2013-09-02 NOTE — Telephone Encounter (Signed)
Spoke to patient letting him know of new med he is to take and to continue to monitor his blood pressure.

## 2013-09-02 NOTE — Telephone Encounter (Signed)
Spoke to patient earlier regarding a new med that has been sent to his pharmacy for his blood pressure

## 2013-09-02 NOTE — Telephone Encounter (Signed)
09/02/2013  Pt left message returning call.  No other info given.

## 2013-09-04 ENCOUNTER — Encounter: Payer: Self-pay | Admitting: Internal Medicine

## 2013-10-13 ENCOUNTER — Other Ambulatory Visit: Payer: Self-pay | Admitting: Internal Medicine

## 2013-10-15 ENCOUNTER — Encounter: Payer: Self-pay | Admitting: Internal Medicine

## 2013-10-18 MED ORDER — FUROSEMIDE 20 MG PO TABS: 20.0000 mg | ORAL_TABLET | Freq: Every day | ORAL | Status: DC

## 2014-04-07 ENCOUNTER — Ambulatory Visit (INDEPENDENT_AMBULATORY_CARE_PROVIDER_SITE_OTHER): Payer: Medicare Other | Admitting: Podiatry

## 2014-04-07 ENCOUNTER — Ambulatory Visit (INDEPENDENT_AMBULATORY_CARE_PROVIDER_SITE_OTHER): Payer: Medicare Other

## 2014-04-07 ENCOUNTER — Encounter: Payer: Self-pay | Admitting: Podiatry

## 2014-04-07 VITALS — BP 141/88 | HR 61 | Resp 16

## 2014-04-07 DIAGNOSIS — M779 Enthesopathy, unspecified: Secondary | ICD-10-CM

## 2014-04-07 DIAGNOSIS — L600 Ingrowing nail: Secondary | ICD-10-CM

## 2014-04-07 NOTE — Progress Notes (Signed)
   Subjective:    Patient ID: Timothy Savage, male    DOB: 22-Jan-1941, 73 y.o.   MRN: [id number]  HPI Comments: 5 weeks ago a timber got dropped on both of my feet. The big toe on my left foot has gotten worse. Its really sore like an ingrown toenail. It hurts with shoes and walking. i have done nothing for my feet.  Foot Pain      Review of Systems  HENT: Positive for hearing loss.        Ringing in ears  Musculoskeletal:       Joint pain  All other systems reviewed and are negative.      Objective:   Physical Exam        Assessment & Plan:

## 2014-04-07 NOTE — Patient Instructions (Signed)

## 2014-04-07 NOTE — Progress Notes (Signed)
Subjective:     Patient ID: Timothy Savage, male   DOB: Apr 24, 1941, 73 y.o.   MRN: [id number]  Foot Pain   patient presents stating that December was dropped on his foot and his nails discolored and he is developed an ingrown toenail left big toe medial border that is painful when pressed and he cannot trim himself   Review of Systems  All other systems reviewed and are negative.      Objective:   Physical Exam  Nursing note and vitals reviewed. Constitutional: He is oriented to person, place, and time.  Cardiovascular: Intact distal pulses.   Musculoskeletal: Normal range of motion.  Neurological: He is oriented to person, place, and time.  Skin: Skin is warm.   neurovascular status intact with muscle strength adequate and range of motion of the subtalar and midtarsal joint normal. Digits are well perfused and there is edema in the forefoot region bilateral secondary to trauma and discoloration of the left big toenail with incurvation of the medial border that sore when pressed     Assessment:     Traumatized feet secondary to dropping a heavy wooden object and ingrown toenail left hallux    Plan:     H&P and x-rays reviewed. Recommended correction of the nail and today I infiltrated the left hallux 60 mg Xylocaine Marcaine mixture remove the medial border exposed matrix and apply chemical phenol 3 applications followed by alcohol lavaged sterile dressing. Reappoint her recheck if symptoms persist

## 2014-05-27 ENCOUNTER — Encounter: Payer: Self-pay | Admitting: Family Medicine

## 2014-05-29 NOTE — Telephone Encounter (Signed)
Call in #[phone] with 11 rf 

## 2014-05-30 MED ORDER — HYOSCYAMINE SULFATE ER 0.375 MG PO TB12: 0.3750 mg | ORAL_TABLET | Freq: Two times a day (BID) | ORAL | Status: AC | PRN

## 2014-06-11 ENCOUNTER — Encounter: Payer: Self-pay | Admitting: Gastroenterology

## 2014-09-23 ENCOUNTER — Telehealth: Payer: Self-pay | Admitting: Internal Medicine

## 2014-09-23 MED ORDER — LOSARTAN POTASSIUM 50 MG PO TABS
50.0000 mg | ORAL_TABLET | Freq: Every day | ORAL | Status: DC
Start: 2014-09-23 — End: 2014-11-10

## 2014-09-23 MED ORDER — ATORVASTATIN CALCIUM 10 MG PO TABS: 10.0000 mg | ORAL_TABLET | Freq: Every day | ORAL | Status: DC

## 2014-09-23 NOTE — Telephone Encounter (Signed)
Please add atorvastatin (LIPITOR) 10 MG tablet to the below re-fill request

## 2014-09-23 NOTE — Telephone Encounter (Signed)
[facility] [id number] - [place], [place] - [address] [phone] is requesting re-fill on losartan (COZAAR) 50 MG tablet

## 2014-09-23 NOTE — Telephone Encounter (Signed)
I sent both scripts e-scribe. 

## 2014-10-10 ENCOUNTER — Ambulatory Visit (INDEPENDENT_AMBULATORY_CARE_PROVIDER_SITE_OTHER): Payer: Medicare Other | Admitting: Family Medicine

## 2014-10-10 ENCOUNTER — Encounter: Payer: Self-pay | Admitting: Family Medicine

## 2014-10-10 VITALS — BP 144/94 | HR 72 | Temp 98.8°F | Ht 70.0 in | Wt 168.0 lb

## 2014-10-10 DIAGNOSIS — Z0000 Encounter for general adult medical examination without abnormal findings: Secondary | ICD-10-CM

## 2014-10-10 DIAGNOSIS — R739 Hyperglycemia, unspecified: Secondary | ICD-10-CM

## 2014-10-10 DIAGNOSIS — L659 Nonscarring hair loss, unspecified: Secondary | ICD-10-CM

## 2014-10-10 DIAGNOSIS — F411 Generalized anxiety disorder: Secondary | ICD-10-CM

## 2014-10-10 MED ORDER — LORAZEPAM 1 MG PO TABS: 1.0000 mg | ORAL_TABLET | Freq: Four times a day (QID) | ORAL | Status: DC | PRN

## 2014-10-10 MED ORDER — FINASTERIDE 1 MG PO TABS: 1.0000 mg | ORAL_TABLET | Freq: Every day | ORAL | Status: AC

## 2014-10-10 NOTE — Progress Notes (Signed)
   Subjective:    Patient ID: Timothy Savage, male    DOB: 01/20/1941, 73 y.o.   MRN: [id number]  HPI 73 yr old male to establish with us after transferring from Dr. Debby BudNorins. He feels well in general. His anxiety has been well controlled but he needs a refill on Ativan. He is past due for a colonoscopy, but he plans to set this up right after the holidays.    Review of Systems  Constitutional: Negative.   Respiratory: Negative.   Cardiovascular: Negative.   Psychiatric/Behavioral: Negative.        Objective:   Physical Exam  Constitutional: He is oriented to person, place, and time. He appears well-developed and well-nourished.  Cardiovascular: Normal rate, regular rhythm, normal heart sounds and intact distal pulses.   Pulmonary/Chest: Effort normal and breath sounds normal.  Neurological: He is alert and oriented to person, place, and time.  Psychiatric: He has a normal mood and affect. His behavior is normal. Thought content normal.          Assessment & Plan:  He is doing well. meds were refilled. He is scheduled for labs and a cpx with us in a few weeks.

## 2014-10-12 ENCOUNTER — Encounter: Payer: Self-pay | Admitting: Family Medicine

## 2014-10-13 ENCOUNTER — Encounter: Payer: Self-pay | Admitting: Family Medicine

## 2014-10-13 NOTE — Telephone Encounter (Signed)
Okay per Dr. Clent RidgesFry to resend script.

## 2014-10-14 MED ORDER — LORAZEPAM 1 MG PO TABS: 1.0000 mg | ORAL_TABLET | Freq: Every day | ORAL | Status: AC

## 2014-11-02 ENCOUNTER — Other Ambulatory Visit (INDEPENDENT_AMBULATORY_CARE_PROVIDER_SITE_OTHER): Payer: Medicare Other

## 2014-11-02 DIAGNOSIS — R739 Hyperglycemia, unspecified: Secondary | ICD-10-CM

## 2014-11-02 DIAGNOSIS — Z0000 Encounter for general adult medical examination without abnormal findings: Secondary | ICD-10-CM

## 2014-11-02 LAB — CBC WITH DIFFERENTIAL/PLATELET
BASOS PCT: 0.3 % (ref 0.0–3.0)
Basophils Absolute: 0 10*3/uL (ref 0.0–0.1)
EOS ABS: 0.3 10*3/uL (ref 0.0–0.7)
Eosinophils Relative: 4.3 % (ref 0.0–5.0)
HCT: 42.6 % (ref 39.0–52.0)
HEMOGLOBIN: 14 g/dL (ref 13.0–17.0)
Lymphocytes Relative: 22.2 % (ref 12.0–46.0)
Lymphs Abs: 1.4 10*3/uL (ref 0.7–4.0)
MCHC: 32.8 g/dL (ref 30.0–36.0)
MCV: 100.3 fl — AB (ref 78.0–100.0)
MONO ABS: 0.5 10*3/uL (ref 0.1–1.0)
Monocytes Relative: 8.5 % (ref 3.0–12.0)
Neutro Abs: 4.2 10*3/uL (ref 1.4–7.7)
Neutrophils Relative %: 64.7 % (ref 43.0–77.0)
Platelets: 264 10*3/uL (ref 150.0–400.0)
RBC: 4.24 Mil/uL (ref 4.22–5.81)
RDW: 14.4 % (ref 11.5–15.5)
WBC: 6.4 10*3/uL (ref 4.0–10.5)

## 2014-11-02 LAB — POCT URINALYSIS DIPSTICK
Bilirubin, UA: NEGATIVE
GLUCOSE UA: NEGATIVE
Ketones, UA: NEGATIVE
Leukocytes, UA: NEGATIVE
Nitrite, UA: NEGATIVE
PH UA: 5
PROTEIN UA: NEGATIVE
Spec Grav, UA: 1.01
UROBILINOGEN UA: 0.2

## 2014-11-02 LAB — BASIC METABOLIC PANEL
BUN: 19 mg/dL (ref 6–23)
CALCIUM: 9.2 mg/dL (ref 8.4–10.5)
CO2: 27 mEq/L (ref 19–32)
Chloride: 106 mEq/L (ref 96–112)
Creatinine, Ser: 1 mg/dL (ref 0.4–1.5)
GFR: 82.83 mL/min (ref >60.00)
GLUCOSE: 101 mg/dL — AB (ref 70–99)
POTASSIUM: 4.2 meq/L (ref 3.5–5.1)
Sodium: 140 mEq/L (ref 135–145)

## 2014-11-02 LAB — HEPATIC FUNCTION PANEL
ALBUMIN: 4 g/dL (ref 3.5–5.2)
ALT: 23 U/L (ref 0–53)
AST: 25 U/L (ref 0–37)
Alkaline Phosphatase: 68 U/L (ref 39–117)
Bilirubin, Direct: 0.1 mg/dL (ref 0.0–0.3)
Total Bilirubin: 0.9 mg/dL (ref 0.2–1.2)
Total Protein: 6.6 g/dL (ref 6.0–8.3)

## 2014-11-02 LAB — PSA: PSA: 0.07 ng/mL — ABNORMAL LOW (ref 0.10–4.00)

## 2014-11-02 LAB — LIPID PANEL
CHOL/HDL RATIO: 3
CHOLESTEROL: 150 mg/dL (ref 0–200)
HDL: 55.3 mg/dL (ref >39.00)
LDL Cholesterol: 79 mg/dL (ref 0–99)
NonHDL: 94.7
TRIGLYCERIDES: 79 mg/dL (ref 0.0–149.0)
VLDL: 15.8 mg/dL (ref 0.0–40.0)

## 2014-11-02 LAB — HEMOGLOBIN A1C: Hgb A1c MFr Bld: 6.4 % (ref 4.6–6.5)

## 2014-11-02 LAB — TSH: TSH: 1.54 u[IU]/mL (ref 0.35–4.50)

## 2014-11-10 ENCOUNTER — Ambulatory Visit (INDEPENDENT_AMBULATORY_CARE_PROVIDER_SITE_OTHER): Payer: Medicare Other | Admitting: Family Medicine

## 2014-11-10 ENCOUNTER — Encounter: Payer: Self-pay | Admitting: Family Medicine

## 2014-11-10 VITALS — BP 158/99 | HR 64 | Temp 98.7°F | Ht 70.0 in | Wt 167.0 lb

## 2014-11-10 DIAGNOSIS — E785 Hyperlipidemia, unspecified: Secondary | ICD-10-CM

## 2014-11-10 DIAGNOSIS — Z0000 Encounter for general adult medical examination without abnormal findings: Secondary | ICD-10-CM

## 2014-11-10 MED ORDER — ATORVASTATIN CALCIUM 10 MG PO TABS: 10.0000 mg | ORAL_TABLET | Freq: Every day | ORAL | Status: AC

## 2014-11-10 MED ORDER — LOSARTAN POTASSIUM 50 MG PO TABS: 50.0000 mg | ORAL_TABLET | Freq: Every day | ORAL | Status: AC

## 2014-11-10 MED ORDER — FUROSEMIDE 20 MG PO TABS
20.0000 mg | ORAL_TABLET | Freq: Every day | ORAL | Status: DC
Start: 2014-11-10 — End: 2015-10-16

## 2014-11-10 MED ORDER — VALACYCLOVIR HCL 500 MG PO TABS: 500.0000 mg | ORAL_TABLET | Freq: Two times a day (BID) | ORAL | Status: DC

## 2014-11-10 NOTE — Progress Notes (Signed)
Pre visit review using our clinic review tool, if applicable. No additional management support is needed unless otherwise documented below in the visit note. 

## 2014-11-10 NOTE — Progress Notes (Signed)
   Subjective:    Patient ID: Timothy Savage, male    DOB: 08/04/1941, 74 y.o.   MRN: [id number]  HPI 74 yr old male for a cpx. He feels well. He just recovered from a case of herpes zoster in the right eye. He had redness and pain until this was diagnosed by [facility]athe[facility]en [facility]. He took Valtrex 1000 mg tid for 10 days and this did the trick. He is past due for a colonoscopy. His BP at home is stable in the 130s over 80s.    Review of Systems  Constitutional: Negative.   HENT: Negative.   Eyes: Negative.   Respiratory: Negative.   Cardiovascular: Negative.   Gastrointestinal: Negative.   Genitourinary: Negative.   Musculoskeletal: Negative.   Skin: Negative.   Neurological: Negative.   Psychiatric/Behavioral: Negative.        Objective:   Physical Exam  Constitutional: He is oriented to person, place, and time. He appears well-developed and well-nourished. No distress.  HENT:  Head: Normocephalic and atraumatic.  Right Ear: External ear normal.  Left Ear: External ear normal.  Nose: Nose normal.  Mouth/Throat: Oropharynx is clear and moist. No oropharyngeal exudate.  Eyes: Conjunctivae and EOM are normal. Pupils are equal, round, and reactive to light. Right eye exhibits no discharge. Left eye exhibits no discharge. No scleral icterus.  Neck: Neck supple. No JVD present. No tracheal deviation present. No thyromegaly present.  Cardiovascular: Normal rate, regular rhythm, normal heart sounds and intact distal pulses.  Exam reveals no gallop and no friction rub.   No murmur heard. EKG normal   Pulmonary/Chest: Effort normal and breath sounds normal. No respiratory distress. He has no wheezes. He has no rales. He exhibits no tenderness.  Abdominal: Soft. Bowel sounds are normal. He exhibits no distension and no mass. There is no tenderness. There is no rebound and no guarding.  Genitourinary: Rectum normal, prostate normal and penis normal. Guaiac negative stool.  No penile tenderness.  Musculoskeletal: Normal range of motion. He exhibits no edema or tenderness.  Lymphadenopathy:    He has no cervical adenopathy.  Neurological: He is alert and oriented to person, place, and time. He has normal reflexes. No cranial nerve deficit. He exhibits normal muscle tone. Coordination normal.  Skin: Skin is warm and dry. No rash noted. He is not diaphoretic. No erythema. No pallor.  Psychiatric: He has a normal mood and affect. His behavior is normal. Judgment and thought content normal.          Assessment & Plan:  Well exam. Set up a colonoscopy.

## 2014-11-15 ENCOUNTER — Ambulatory Visit (AMBULATORY_SURGERY_CENTER): Payer: Self-pay

## 2014-11-15 VITALS — Ht 70.0 in | Wt 169.2 lb

## 2014-11-15 DIAGNOSIS — Z800 Family history of malignant neoplasm of digestive organs: Secondary | ICD-10-CM

## 2014-11-15 MED ORDER — MOVIPREP 100 G PO SOLR: ORAL | Status: DC

## 2014-11-15 NOTE — Progress Notes (Signed)
Per pt, no allergies to soy or egg products.Pt not taking any weight loss meds or using  O2 at home. 

## 2014-11-17 ENCOUNTER — Telehealth: Payer: Self-pay | Admitting: Internal Medicine

## 2014-11-17 NOTE — Telephone Encounter (Signed)
Pt states prep is > 80 $$ and he cannot afford this. States the pharmacy told him we should have a coupon for this here. Unfortunately neither PV room has a coupon but we did have a sample of movi prep. Pt states he will be here to pick up on forth floor desk Friday am. Prep placed at desk, forth floor for pt to P/U with his name on prep.   marie PV

## 2014-11-29 ENCOUNTER — Encounter: Payer: Self-pay | Admitting: Internal Medicine

## 2014-11-29 ENCOUNTER — Ambulatory Visit (AMBULATORY_SURGERY_CENTER): Payer: Medicare Other | Admitting: Internal Medicine

## 2014-11-29 VITALS — BP 152/99 | HR 60 | Temp 97.8°F | Resp 22 | Ht 70.0 in | Wt 167.0 lb

## 2014-11-29 DIAGNOSIS — Z1211 Encounter for screening for malignant neoplasm of colon: Secondary | ICD-10-CM

## 2014-11-29 DIAGNOSIS — Z800 Family history of malignant neoplasm of digestive organs: Secondary | ICD-10-CM

## 2014-11-29 MED ORDER — SODIUM CHLORIDE 0.9 % IV SOLN: 500.0000 mL | INTRAVENOUS | Status: DC

## 2014-11-29 NOTE — Progress Notes (Signed)
Patient awakening,vss,report to rn 

## 2014-11-29 NOTE — Patient Instructions (Signed)

## 2014-11-29 NOTE — Op Note (Signed)
[facility] [address] [place], [id number]   COLONOSCOPY PROCEDURE REPORT  PATIENT: Timothy Savage, Timothy Savage  MR#: [id number] BIRTHDATE: 1941/07/22 , 74  yrs. old GENDER: male ENDOSCOPIST: Roxy CedarJohn N Shakeema Lippman Jr, MD REFERRED XB:[id number]:Screening Recall, PROCEDURE DATE:  11/29/2014 PROCEDURE:   Colonoscopy, screening First Screening Colonoscopy - Avg.  risk and is 50 yrs.  old or older - No.  Prior Negative Screening - Now for repeat screening. 10 or more years since last screening  History of Adenoma - Now for follow-up colonoscopy & has been > or = to 3 yrs.  N/A  Polyps Removed Today? No.  Polyps Removed Today? No.  Recommend repeat exam, <10 yrs? No. ASA CLASS:   Class II INDICATIONS:average risk for colon cancer. . Negative index exam 2005 [facility]) MEDICATIONS: Monitored anesthesia care and Propofol 200 mg IV  DESCRIPTION OF PROCEDURE:   After the risks benefits and alternatives of the procedure were thoroughly explained, informed consent was obtained.  The digital rectal exam revealed no abnormalities of the rectum.   The LB [id number]  endoscope was introduced through the anus and advanced to the cecum, which was identified by both the appendix and ileocecal valve. No adverse events experienced.   The quality of the prep was excellent, using MoviPrep  The instrument was then slowly withdrawn as the colon was fully examined.    COLON FINDINGS: There was moderate diverticulosis noted in the sigmoid colon.   The examination was otherwise normal.  Retroflexed views revealed internal hemorrhoids. The time to cecum=1 minutes 30 seconds.  Withdrawal time=7 minutes 27 seconds.  The scope was withdrawn and the procedure completed. COMPLICATIONS: There were no immediate complications.  ENDOSCOPIC IMPRESSION: 1.   Moderate diverticulosis was noted in the sigmoid colon 2.   The examination was otherwise normal  RECOMMENDATIONS: 1. Return to the care of your primary provider.   GI follow up as needed  eSigned:  Roxy CedarJohn N Tranell Wojtkiewicz Jr, MD 11/29/2014 9:41 AM   cc: Nelwyn SalisburyStephen A Fry, MD and The Patient

## 2014-11-30 ENCOUNTER — Telehealth: Payer: Self-pay | Admitting: *Deleted

## 2014-11-30 NOTE — Telephone Encounter (Signed)
Number identifier, left message, follow-up  

## 2015-10-16 ENCOUNTER — Other Ambulatory Visit: Payer: Self-pay | Admitting: Family Medicine

## 2016-10-16 DIAGNOSIS — K219 Gastro-esophageal reflux disease without esophagitis: Secondary | ICD-10-CM | POA: Diagnosis not present

## 2016-10-16 DIAGNOSIS — K146 Glossodynia: Secondary | ICD-10-CM | POA: Diagnosis not present

## 2016-11-12 DIAGNOSIS — Z125 Encounter for screening for malignant neoplasm of prostate: Secondary | ICD-10-CM | POA: Diagnosis not present

## 2016-11-12 DIAGNOSIS — E784 Other hyperlipidemia: Secondary | ICD-10-CM | POA: Diagnosis not present

## 2016-11-12 DIAGNOSIS — I10 Essential (primary) hypertension: Secondary | ICD-10-CM | POA: Diagnosis not present

## 2016-11-12 DIAGNOSIS — R7301 Impaired fasting glucose: Secondary | ICD-10-CM | POA: Diagnosis not present

## 2016-11-13 DIAGNOSIS — K146 Glossodynia: Secondary | ICD-10-CM | POA: Diagnosis not present

## 2016-11-19 DIAGNOSIS — L658 Other specified nonscarring hair loss: Secondary | ICD-10-CM | POA: Diagnosis not present

## 2016-11-19 DIAGNOSIS — Z6824 Body mass index (BMI) 24.0-24.9, adult: Secondary | ICD-10-CM | POA: Diagnosis not present

## 2016-11-19 DIAGNOSIS — R7301 Impaired fasting glucose: Secondary | ICD-10-CM | POA: Diagnosis not present

## 2016-11-19 DIAGNOSIS — E784 Other hyperlipidemia: Secondary | ICD-10-CM | POA: Diagnosis not present

## 2016-11-19 DIAGNOSIS — Z0000 Encounter for general adult medical examination without abnormal findings: Secondary | ICD-10-CM | POA: Diagnosis not present

## 2016-11-19 DIAGNOSIS — R030 Elevated blood-pressure reading, without diagnosis of hypertension: Secondary | ICD-10-CM | POA: Diagnosis not present

## 2016-11-19 DIAGNOSIS — Z8619 Personal history of other infectious and parasitic diseases: Secondary | ICD-10-CM | POA: Diagnosis not present

## 2016-11-19 DIAGNOSIS — I10 Essential (primary) hypertension: Secondary | ICD-10-CM | POA: Diagnosis not present

## 2016-11-19 DIAGNOSIS — K589 Irritable bowel syndrome without diarrhea: Secondary | ICD-10-CM | POA: Diagnosis not present

## 2016-11-19 DIAGNOSIS — J312 Chronic pharyngitis: Secondary | ICD-10-CM | POA: Diagnosis not present

## 2016-11-21 DIAGNOSIS — Z1212 Encounter for screening for malignant neoplasm of rectum: Secondary | ICD-10-CM | POA: Diagnosis not present

## 2016-12-06 DIAGNOSIS — D2262 Melanocytic nevi of left upper limb, including shoulder: Secondary | ICD-10-CM | POA: Diagnosis not present

## 2016-12-06 DIAGNOSIS — D1721 Benign lipomatous neoplasm of skin and subcutaneous tissue of right arm: Secondary | ICD-10-CM | POA: Diagnosis not present

## 2016-12-06 DIAGNOSIS — L723 Sebaceous cyst: Secondary | ICD-10-CM | POA: Diagnosis not present

## 2016-12-06 DIAGNOSIS — L821 Other seborrheic keratosis: Secondary | ICD-10-CM | POA: Diagnosis not present

## 2017-03-04 DIAGNOSIS — H21233 Degeneration of iris (pigmentary), bilateral: Secondary | ICD-10-CM | POA: Diagnosis not present

## 2017-03-04 DIAGNOSIS — H40013 Open angle with borderline findings, low risk, bilateral: Secondary | ICD-10-CM | POA: Diagnosis not present

## 2017-03-04 DIAGNOSIS — H2513 Age-related nuclear cataract, bilateral: Secondary | ICD-10-CM | POA: Diagnosis not present

## 2017-04-28 DIAGNOSIS — Z6824 Body mass index (BMI) 24.0-24.9, adult: Secondary | ICD-10-CM | POA: Diagnosis not present

## 2017-04-28 DIAGNOSIS — L02215 Cutaneous abscess of perineum: Secondary | ICD-10-CM | POA: Diagnosis not present

## 2017-06-04 DIAGNOSIS — H21233 Degeneration of iris (pigmentary), bilateral: Secondary | ICD-10-CM | POA: Diagnosis not present

## 2017-06-04 DIAGNOSIS — H2513 Age-related nuclear cataract, bilateral: Secondary | ICD-10-CM | POA: Diagnosis not present

## 2017-07-11 DIAGNOSIS — H2513 Age-related nuclear cataract, bilateral: Secondary | ICD-10-CM | POA: Diagnosis not present

## 2017-07-11 DIAGNOSIS — H21233 Degeneration of iris (pigmentary), bilateral: Secondary | ICD-10-CM | POA: Diagnosis not present

## 2017-07-19 DIAGNOSIS — Z23 Encounter for immunization: Secondary | ICD-10-CM | POA: Diagnosis not present

## 2017-07-24 ENCOUNTER — Encounter: Payer: Self-pay | Admitting: Family Medicine

## 2017-09-04 DIAGNOSIS — H2511 Age-related nuclear cataract, right eye: Secondary | ICD-10-CM | POA: Diagnosis not present

## 2017-09-04 DIAGNOSIS — H25811 Combined forms of age-related cataract, right eye: Secondary | ICD-10-CM | POA: Diagnosis not present

## 2017-11-27 DIAGNOSIS — R7301 Impaired fasting glucose: Secondary | ICD-10-CM | POA: Diagnosis not present

## 2017-11-27 DIAGNOSIS — R82998 Other abnormal findings in urine: Secondary | ICD-10-CM | POA: Diagnosis not present

## 2017-11-27 DIAGNOSIS — Z125 Encounter for screening for malignant neoplasm of prostate: Secondary | ICD-10-CM | POA: Diagnosis not present

## 2017-11-27 DIAGNOSIS — E7849 Other hyperlipidemia: Secondary | ICD-10-CM | POA: Diagnosis not present

## 2017-11-27 DIAGNOSIS — I10 Essential (primary) hypertension: Secondary | ICD-10-CM | POA: Diagnosis not present

## 2017-12-04 DIAGNOSIS — I10 Essential (primary) hypertension: Secondary | ICD-10-CM | POA: Diagnosis not present

## 2017-12-04 DIAGNOSIS — E7849 Other hyperlipidemia: Secondary | ICD-10-CM | POA: Diagnosis not present

## 2017-12-04 DIAGNOSIS — R7301 Impaired fasting glucose: Secondary | ICD-10-CM | POA: Diagnosis not present

## 2017-12-04 DIAGNOSIS — Z0000 Encounter for general adult medical examination without abnormal findings: Secondary | ICD-10-CM | POA: Diagnosis not present

## 2017-12-05 DIAGNOSIS — Z1212 Encounter for screening for malignant neoplasm of rectum: Secondary | ICD-10-CM | POA: Diagnosis not present

## 2017-12-08 DIAGNOSIS — D1721 Benign lipomatous neoplasm of skin and subcutaneous tissue of right arm: Secondary | ICD-10-CM | POA: Diagnosis not present

## 2017-12-08 DIAGNOSIS — D2262 Melanocytic nevi of left upper limb, including shoulder: Secondary | ICD-10-CM | POA: Diagnosis not present

## 2017-12-08 DIAGNOSIS — L821 Other seborrheic keratosis: Secondary | ICD-10-CM | POA: Diagnosis not present

## 2017-12-08 DIAGNOSIS — L820 Inflamed seborrheic keratosis: Secondary | ICD-10-CM | POA: Diagnosis not present

## 2018-04-27 DIAGNOSIS — H2512 Age-related nuclear cataract, left eye: Secondary | ICD-10-CM | POA: Diagnosis not present

## 2018-04-27 DIAGNOSIS — H5000 Unspecified esotropia: Secondary | ICD-10-CM | POA: Diagnosis not present

## 2018-04-27 DIAGNOSIS — H21233 Degeneration of iris (pigmentary), bilateral: Secondary | ICD-10-CM | POA: Diagnosis not present

## 2018-04-27 DIAGNOSIS — H40013 Open angle with borderline findings, low risk, bilateral: Secondary | ICD-10-CM | POA: Diagnosis not present

## 2018-07-28 DIAGNOSIS — H401331 Pigmentary glaucoma, bilateral, mild stage: Secondary | ICD-10-CM | POA: Diagnosis not present

## 2018-07-28 DIAGNOSIS — B0052 Herpesviral keratitis: Secondary | ICD-10-CM | POA: Diagnosis not present

## 2018-08-01 DIAGNOSIS — Z23 Encounter for immunization: Secondary | ICD-10-CM | POA: Diagnosis not present

## 2018-11-18 DIAGNOSIS — H2512 Age-related nuclear cataract, left eye: Secondary | ICD-10-CM | POA: Diagnosis not present

## 2018-11-18 DIAGNOSIS — H21233 Degeneration of iris (pigmentary), bilateral: Secondary | ICD-10-CM | POA: Diagnosis not present

## 2018-12-04 DIAGNOSIS — R7301 Impaired fasting glucose: Secondary | ICD-10-CM | POA: Diagnosis not present

## 2018-12-04 DIAGNOSIS — Z0000 Encounter for general adult medical examination without abnormal findings: Secondary | ICD-10-CM | POA: Diagnosis not present

## 2018-12-04 DIAGNOSIS — E7849 Other hyperlipidemia: Secondary | ICD-10-CM | POA: Diagnosis not present

## 2018-12-04 DIAGNOSIS — I10 Essential (primary) hypertension: Secondary | ICD-10-CM | POA: Diagnosis not present

## 2018-12-04 DIAGNOSIS — R82998 Other abnormal findings in urine: Secondary | ICD-10-CM | POA: Diagnosis not present

## 2018-12-07 DIAGNOSIS — Z1212 Encounter for screening for malignant neoplasm of rectum: Secondary | ICD-10-CM | POA: Diagnosis not present

## 2018-12-08 DIAGNOSIS — D1721 Benign lipomatous neoplasm of skin and subcutaneous tissue of right arm: Secondary | ICD-10-CM | POA: Diagnosis not present

## 2018-12-08 DIAGNOSIS — L820 Inflamed seborrheic keratosis: Secondary | ICD-10-CM | POA: Diagnosis not present

## 2018-12-08 DIAGNOSIS — L570 Actinic keratosis: Secondary | ICD-10-CM | POA: Diagnosis not present

## 2018-12-08 DIAGNOSIS — L218 Other seborrheic dermatitis: Secondary | ICD-10-CM | POA: Diagnosis not present

## 2018-12-11 DIAGNOSIS — E7849 Other hyperlipidemia: Secondary | ICD-10-CM | POA: Diagnosis not present

## 2018-12-11 DIAGNOSIS — Z0000 Encounter for general adult medical examination without abnormal findings: Secondary | ICD-10-CM | POA: Diagnosis not present

## 2018-12-11 DIAGNOSIS — R030 Elevated blood-pressure reading, without diagnosis of hypertension: Secondary | ICD-10-CM | POA: Diagnosis not present

## 2018-12-11 DIAGNOSIS — I10 Essential (primary) hypertension: Secondary | ICD-10-CM | POA: Diagnosis not present

## 2018-12-17 DIAGNOSIS — H2512 Age-related nuclear cataract, left eye: Secondary | ICD-10-CM | POA: Diagnosis not present

## 2018-12-17 DIAGNOSIS — H25812 Combined forms of age-related cataract, left eye: Secondary | ICD-10-CM | POA: Diagnosis not present

## 2019-01-15 ENCOUNTER — Emergency Department (HOSPITAL_COMMUNITY): Payer: Medicare Other

## 2019-01-15 ENCOUNTER — Emergency Department (HOSPITAL_COMMUNITY)
Admission: EM | Admit: 2019-01-15 | Discharge: 2019-01-16 | Disposition: A | Discharge status: Home / Self Care | Payer: Medicare Other | Attending: Emergency Medicine | Admitting: Emergency Medicine

## 2019-01-15 ENCOUNTER — Other Ambulatory Visit: Payer: Self-pay

## 2019-01-15 DIAGNOSIS — R42 Dizziness and giddiness: Secondary | ICD-10-CM | POA: Insufficient documentation

## 2019-01-15 DIAGNOSIS — I639 Cerebral infarction, unspecified: Secondary | ICD-10-CM | POA: Diagnosis not present

## 2019-01-15 DIAGNOSIS — R410 Disorientation, unspecified: Secondary | ICD-10-CM | POA: Diagnosis not present

## 2019-01-15 DIAGNOSIS — R1111 Vomiting without nausea: Secondary | ICD-10-CM | POA: Diagnosis not present

## 2019-01-15 DIAGNOSIS — R110 Nausea: Secondary | ICD-10-CM | POA: Diagnosis not present

## 2019-01-15 DIAGNOSIS — R531 Weakness: Secondary | ICD-10-CM | POA: Diagnosis not present

## 2019-01-15 DIAGNOSIS — R112 Nausea with vomiting, unspecified: Secondary | ICD-10-CM | POA: Diagnosis not present

## 2019-01-15 LAB — COMPREHENSIVE METABOLIC PANEL
ALT: 21 U/L (ref 0–44)
AST: 24 U/L (ref 15–41)
Albumin: 3.8 g/dL (ref 3.5–5.0)
Alkaline Phosphatase: 51 U/L (ref 38–126)
Anion gap: 7 (ref 5–15)
BUN: 20 mg/dL (ref 8–23)
CALCIUM: 8.7 mg/dL — AB (ref 8.9–10.3)
CO2: 21 mmol/L — AB (ref 22–32)
Chloride: 108 mmol/L (ref 98–111)
Creatinine, Ser: 0.95 mg/dL (ref 0.61–1.24)
GFR calc Af Amer: >60 mL/min (ref >60)
GFR calc non Af Amer: >60 mL/min (ref >60)
Glucose, Bld: 129 mg/dL — ABNORMAL HIGH (ref 70–99)
Potassium: 3.3 mmol/L — ABNORMAL LOW (ref 3.5–5.1)
Sodium: 136 mmol/L (ref 135–145)
Total Bilirubin: 0.9 mg/dL (ref 0.3–1.2)
Total Protein: 6.4 g/dL — ABNORMAL LOW (ref 6.5–8.1)

## 2019-01-15 LAB — CBC WITH DIFFERENTIAL/PLATELET
Abs Immature Granulocytes: 0.05 10*3/uL (ref 0.00–0.07)
Basophils Absolute: 0 10*3/uL (ref 0.0–0.1)
Basophils Relative: 0 %
Eosinophils Absolute: 0.1 10*3/uL (ref 0.0–0.5)
Eosinophils Relative: 1 %
HCT: 37.1 % — ABNORMAL LOW (ref 39.0–52.0)
Hemoglobin: 12.4 g/dL — ABNORMAL LOW (ref 13.0–17.0)
Immature Granulocytes: 0 %
Lymphocytes Relative: 9 %
Lymphs Abs: 1.2 10*3/uL (ref 0.7–4.0)
MCH: 34.7 pg — ABNORMAL HIGH (ref 26.0–34.0)
MCHC: 33.4 g/dL (ref 30.0–36.0)
MCV: 103.9 fL — ABNORMAL HIGH (ref 80.0–100.0)
MONO ABS: 0.9 10*3/uL (ref 0.1–1.0)
Monocytes Relative: 7 %
Neutro Abs: 10.3 10*3/uL — ABNORMAL HIGH (ref 1.7–7.7)
Neutrophils Relative %: 83 %
Platelets: 233 10*3/uL (ref 150–400)
RBC: 3.57 MIL/uL — AB (ref 4.22–5.81)
RDW: 13 % (ref 11.5–15.5)
WBC: 12.5 10*3/uL — ABNORMAL HIGH (ref 4.0–10.5)
nRBC: 0 % (ref 0.0–0.2)

## 2019-01-15 LAB — POCT I-STAT EG7
Bicarbonate: 23.4 mmol/L (ref 20.0–28.0)
Calcium, Ion: 1.06 mmol/L — ABNORMAL LOW (ref 1.15–1.40)
HCT: 37 % — ABNORMAL LOW (ref 39.0–52.0)
Hemoglobin: 12.6 g/dL — ABNORMAL LOW (ref 13.0–17.0)
O2 SAT: 79 %
PCO2 VEN: 31.8 mmHg — AB (ref 44.0–60.0)
Potassium: 3.3 mmol/L — ABNORMAL LOW (ref 3.5–5.1)
Sodium: 139 mmol/L (ref 135–145)
TCO2: 24 mmol/L (ref 22–32)
pH, Ven: 7.476 — ABNORMAL HIGH (ref 7.250–7.430)
pO2, Ven: 40 mmHg (ref 32.0–45.0)

## 2019-01-15 LAB — I-STAT TROPONIN, ED: Troponin i, poc: 0 ng/mL (ref 0.00–0.08)

## 2019-01-15 LAB — I-STAT CREATININE, ED: Creatinine, Ser: 0.9 mg/dL (ref 0.61–1.24)

## 2019-01-15 LAB — CBG MONITORING, ED: Glucose-Capillary: 120 mg/dL — ABNORMAL HIGH (ref 70–99)

## 2019-01-15 MED ORDER — ONDANSETRON HCL 4 MG/2ML IJ SOLN: 4.0000 mg | Freq: Once | INTRAMUSCULAR | Status: DC

## 2019-01-15 MED ORDER — ONDANSETRON HCL 4 MG/2ML IJ SOLN
4.0000 mg | Freq: Once | INTRAMUSCULAR | Status: AC
  Administered 2019-01-15: 4 mg via INTRAVENOUS
  Filled 2019-01-15: qty 2

## 2019-01-15 MED ORDER — LORAZEPAM 2 MG/ML IJ SOLN
1.0000 mg | Freq: Once | INTRAMUSCULAR | Status: AC
  Administered 2019-01-15: 1 mg via INTRAVENOUS
  Filled 2019-01-15: qty 1

## 2019-01-15 MED ORDER — SODIUM CHLORIDE 0.9 % IV BOLUS
1000.0000 mL | Freq: Once | INTRAVENOUS | Status: AC
  Administered 2019-01-15: 1000 mL via INTRAVENOUS

## 2019-01-15 MED ORDER — MECLIZINE HCL 25 MG PO TABS
25.0000 mg | ORAL_TABLET | Freq: Once | ORAL | Status: AC
  Administered 2019-01-15: 25 mg via ORAL
  Filled 2019-01-15: qty 1

## 2019-01-15 MED ORDER — IOHEXOL 350 MG/ML SOLN
75.0000 mL | Freq: Once | INTRAVENOUS | Status: AC | PRN
  Administered 2019-01-15: 75 mL via INTRAVENOUS

## 2019-01-15 NOTE — Consult Note (Signed)
Neurology Consultation  Reason for Consult: Acute code stroke-dizziness, vertigo Referring Physician: Dr. Dalene SeltzerSchlossman  CC: Sudden onset of vertigo  History is obtained from: Patient, wife, chart  HPI: Timothy PouchDaniel J Blunck is a 78 y.o. male past medical history of hypertension, hyperlipidemia, right sensorineural hearing deficit, presented to the emergency room for sudden onset of what he describes as vertigo around 6 PM on 01/15/2019. He was in his usual state of health till 6 PM when he felt room spinning as he changed position and became very nauseous.  He also felt clammy and mildly disoriented at the time.  He was brought in by EMS, evaluated with a stroke screen which was negative, placed in the room and later evaluated by the ED provider who had concern for posterior circulation stroke and called me.  I recommended to activate an acute code stroke since he is within the window for IV TPA. I evaluated the patient even before the code stroke was activated.  NIH stroke scale 0.  Minimal nystagmus on both and gazes.  No focal cranial nerve, motor or sensory or coordination deficits noted.  Denies having been sick prior to presentation.  Feels nauseous right now but no other GI complaints.  No chest pain palpitations shortness of breath.  No fevers chills prior to coming in.  LKW: 6 PM-01/15/2019 tpa given?: no, not likely stroke Premorbid modified Rankin scale (mRS): 0   ROS:ROS was performed and is negative except as noted in the HPI.  Past Medical History:  Diagnosis Date  . ARTHROSCOPY, LEFT KNEE, HX OF 02/23/2008  . ASTHMA, CHILDHOOD 02/23/2008  . Glaucoma   . HEADACHES, HX OF 02/23/2008  . HERPES SIMPLEX INFECTION, TYPE I 02/23/2008  . HYPERLIPIDEMIA 02/23/2008  . Irritable bowel syndrome 02/23/2008  . NECK PAIN 04/22/2008  . Sciatica 04/22/2008  . SENSORINEURAL HEARING LOSS UNILATERAL 05/04/2010   right ear     Family History  Problem Relation Age of Onset  . Diabetes Mother   .  Hypertension Father   . Coronary artery disease Father   . Heart disease Father        CAD/MI.  Marland Kitchen. Colon cancer Paternal Grandmother   . Cancer Neg Hx   . COPD Neg Hx    Diabetes and hypertension in parents.  Coronary artery disease in father.  Colon cancer paternal grandmother.  Otherwise negative history for stroke.  Social History:   reports that he quit smoking about 42 years ago. His smoking use included cigarettes. He has never used smokeless tobacco. He reports current alcohol use. He reports that he does not use drugs.  Medications  Current Facility-Administered Medications:  .  meclizine (ANTIVERT) tablet 25 mg, 25 mg, Oral, Once, Schlossman, Erin, MD .  ondansetron (ZOFRAN) injection 4 mg, 4 mg, Intravenous, Once, Alvira MondaySchlossman, Erin, MD  Current Outpatient Medications:  .  Ascorbic Acid (VITAMIN C PO), Take 1 tablet by mouth daily., Disp: , Rfl:  .  atorvastatin (LIPITOR) 10 MG tablet, Take 1 tablet (10 mg total) by mouth daily., Disp: 90 tablet, Rfl: 3 .  Cholecalciferol (KP VITAMIN D) 1000 UNITS capsule, Take 1,000 Units by mouth daily.  , Disp: , Rfl:  .  Coenzyme Q10 (CO Q-10) 100 MG CAPS, Take 100 mg by mouth daily. , Disp: , Rfl:  .  Cyanocobalamin (VITAMIN B-12 PO), Take 1 tablet by mouth daily., Disp: , Rfl:  .  finasteride (PROPECIA) 1 MG tablet, Take 1 tablet (1 mg total) by mouth daily., Disp: 90  tablet, Rfl: 3 .  Flaxseed, Linseed, (FLAX SEED OIL PO), Take 1 capsule by mouth daily., Disp: , Rfl:  .  hyoscyamine (LEVBID) 0.375 MG 12 hr tablet, Take 1 tablet (0.375 mg total) by mouth every 12 (twelve) hours as needed for cramping., Disp: 60 tablet, Rfl: 11 .  Multiple Vitamins-Minerals (HAIR SKIN NAILS PO), Take 1 tablet by mouth daily., Disp: , Rfl:  .  naproxen (NAPROSYN) 500 MG tablet, Take 500 mg by mouth daily as needed (pain)., Disp: , Rfl:  .  olmesartan-hydrochlorothiazide (BENICAR HCT) 20-12.5 MG tablet, Take 1 tablet by mouth daily., Disp: , Rfl:  .  Omega-3  Fatty Acids (FISH OIL) 1000 MG CAPS, Take 1,000 mg by mouth daily. , Disp: , Rfl:  .  timolol (TIMOPTIC) 0.5 % ophthalmic solution, Place 1 drop into both eyes daily., Disp: 10 mL, Rfl: 3 .  valACYclovir (VALTREX) 1000 MG tablet, Take 1,000 mg by mouth daily., Disp: , Rfl:  .  VITAMIN A PO, Take 1 capsule by mouth daily., Disp: , Rfl:  .  furosemide (LASIX) 20 MG tablet, TAKE 1 TABLET BY MOUTH EVERY DAY. (Patient not taking: Reported on 01/15/2019), Disp: 90 tablet, Rfl: 0 .  LORazepam (ATIVAN) 1 MG tablet, Take 1 tablet (1 mg total) by mouth daily. (Patient not taking: Reported on 01/15/2019), Disp: 30 tablet, Rfl: 5 .  losartan (COZAAR) 50 MG tablet, Take 1 tablet (50 mg total) by mouth daily. (Patient not taking: Reported on 01/15/2019), Disp: 90 tablet, Rfl: 3  Exam: Current vital signs: BP 139/71   Pulse 70   Temp 97.9 F (36.6 C) (Axillary)   Resp 16   Ht 5\' 10"  (1.778 m)   Wt 73.5 kg   SpO2 100%   BMI 23.24 kg/m  Vital signs in last 24 hours: Temp:  [97.9 F (36.6 C)] 97.9 F (36.6 C) (03/13 1903) Pulse Rate:  [70] 70 (03/13 1903) Resp:  [16] 16 (03/13 1903) BP: (139)/(71) 139/71 (03/13 1903) SpO2:  [100 %] 100 % (03/13 1903) Weight:  [73.5 kg] 73.5 kg (03/13 1901)  GENERAL: Awake, alert in mild distress due to nausea HEENT: - Normocephalic and atraumatic, dry mm, no LN++, no Thyromegally LUNGS - Clear to auscultation bilaterally with no wheezes CV - S1S2 RRR, no m/r/g, equal pulses bilaterally. ABDOMEN - Soft, nontender, nondistended with normoactive BS Ext: warm, well perfused, intact peripheral pulses, no edema NEURO:  Mental Status: AA&Ox3  Language: speech is non-dysarthric. Naming, repetition, fluency, and comprehension intact. Cranial Nerves: PERRL. EOMI with end gaze nystagmus on both directions, visual fields full, no facial asymmetry, facial sensation intact, hearing reduced on the right, tongue/uvula/soft palate midline, normal sternocleidomastoid and trapezius  muscle strength. No evidence of tongue atrophy or fibrillations Motor: 5/5 in all 4s Tone: is normal and bulk is normal Sensation- Intact to light touch bilaterall Coordination: Absent ataxia in upper or lower extremities Gait- deferred Hints exam - abnormal head impulse (though difficult to perform as he kept closing his eyes) nystagmus on rest and bidirectional, no skew. NIHSS-0   Labs I have reviewed labs in epic and the results pertinent to this consultation are: CBC    Component Value Date/Time   WBC 6.4 11/02/2014 0927   RBC 4.24 11/02/2014 0927   HGB 14.0 11/02/2014 0927   HCT 42.6 11/02/2014 0927   PLT 264.0 11/02/2014 0927   MCV 100.3 (H) 11/02/2014 0927   MCH 33.0 07/28/2013 1619   MCHC 32.8 11/02/2014 0927   RDW 14.4  11/02/2014 0927   LYMPHSABS 1.4 11/02/2014 0927   MONOABS 0.5 11/02/2014 0927   EOSABS 0.3 11/02/2014 0927   BASOSABS 0.0 11/02/2014 0927    CMP     Component Value Date/Time   NA 140 11/02/2014 0927   K 4.2 11/02/2014 0927   CL 106 11/02/2014 0927   CO2 27 11/02/2014 0927   GLUCOSE 101 (H) 11/02/2014 0927   BUN 19 11/02/2014 0927   CREATININE 1.0 11/02/2014 0927   CALCIUM 9.2 11/02/2014 0927   PROT 6.6 11/02/2014 0927   ALBUMIN 4.0 11/02/2014 0927   AST 25 11/02/2014 0927   ALT 23 11/02/2014 0927   ALKPHOS 68 11/02/2014 0927   BILITOT 0.9 11/02/2014 0927   GFRNONAA 85 (L) 07/28/2013 1619   GFRAA >90 07/28/2013 1619   Imaging I have reviewed the images obtained:  CT-scan of the brain-no acute changes.  Stable from 2014.  No bleed.  Aspects 10 CTA head and neck-no emergent LVO.  Tortuous vessels including tortuous basilar -all vessels open.  Assessment: 78 year old man past history of hypertension hyperlipidemia and right sensorineural hearing deficits presented to the emergency room for sudden onset of vertigo.  His hints exam had nystagmus in both and gazes and mild nystagmus at rest, making me concerned for a central etiology-brainstem  stroke. I have performed a CT angiogram of the head and neck and personally reviewed which does not show any emergent LVO. MRI of the brain has been ordered stat and is pending at this time. If the MRI does not reveal any evidence of stroke, and his symptoms are resolved with meclizine IV fluids and some benzodiazepines, he can be discharged home with a diagnosis of peripheral vertigo but his symptoms remain persistent, in spite of a negative MRI, he might need to be admitted for stroke/TIA work-up.  No TPA as it is unclear if this is a stroke versus peripheral vertigo No LVO on CTA hence not a candidate for EVT  Pression Peripheral vertigo versus central vertigo  Recommendations: Stat MRI- if negative and symptoms improved with meclizine fluids and benzos, can be discharged home with outpatient neurology follow-up.  If MRI is positive for stroke-definitely needs stroke risk factor work-up admission.  If MRI negative for stroke and symptoms persist- might still need admission for posterior circulation TIA work-up.  I will follow-up after imaging is available.  -- Milon Dikes, MD [facility] Pager: [phone] If 7pm to 7am, please call on call as listed on AMION.   ADDENDUM MRI reviewed. Negative for stroke. Recs as above. Relayed to EDP Please call with questions  -- Milon Dikes, MD [facility] Pager: [phone] If 7pm to 7am, please call on call as listed on AMION.

## 2019-01-15 NOTE — ED Notes (Signed)
Delay in lab draw,  edp at bedside. 

## 2019-01-15 NOTE — ED Notes (Signed)
To MRI

## 2019-01-15 NOTE — ED Provider Notes (Signed)
[facility] Provider Note   CSN: [id number] Arrival date & time: 01/15/19  1859    History   Chief Complaint Chief Complaint  Patient presents with   Dizziness    HPI Howard PouchDaniel J Kramlich is a 78 y.o. male.     HPI   78yo male with history of hyperlipidemia who presents with concern for dizziness. At [facility] suddenly felt clammy, nauseas, vomiting, and disoriented, dizzy like the room was spinning and felt weakness.  No focal weakness.  No associated CP, cough, leg swelling, abdominal pain, palpitations, headache, vision changes, slurred speech, numbness or tingling.  Ongoing dyspnea that he associates with the emesis. Continued nausea and sensation off feelign off balance. Vertigo has passed. Tinnitus for 20 years.   Past Medical History:  Diagnosis Date   ARTHROSCOPY, LEFT KNEE, HX OF 02/23/2008   ASTHMA, CHILDHOOD 02/23/2008   Glaucoma    HEADACHES, HX OF 02/23/2008   HERPES SIMPLEX INFECTION, TYPE I 02/23/2008   HYPERLIPIDEMIA 02/23/2008   Irritable bowel syndrome 02/23/2008   NECK PAIN 04/22/2008   Sciatica 04/22/2008   SENSORINEURAL HEARING LOSS UNILATERAL 05/04/2010   right ear    Patient Active Problem List   Diagnosis Date Noted   Hair thinning 10/10/2014   Generalized anxiety disorder 10/10/2014   Need for prophylactic vaccination and inoculation against influenza 07/22/2012   Need for prophylactic vaccination against Streptococcus pneumoniae (pneumococcus) 07/22/2012   Routine health maintenance 05/09/2011   SENSORINEURAL HEARING LOSS UNILATERAL 05/04/2010   NECK PAIN 04/22/2008   Sciatica 04/22/2008   HERPES SIMPLEX INFECTION, TYPE I 02/23/2008   HYPERLIPIDEMIA 02/23/2008   ASTHMA, CHILDHOOD 02/23/2008   IRRITABLE BOWEL SYNDROME 02/23/2008   HEADACHES, HX OF 02/23/2008   ARTHROSCOPY, LEFT KNEE, HX OF 02/23/2008    Past Surgical History:  Procedure Laterality Date   COLONOSCOPY  06-05-04   per Dr.  Arlyce DiceKaplan, clear, repeat in 10 yrs   left knee arthroscopy     Dr. Cleophas DunkerWhitfield   nasal spur excised  80's  (Patesovorus)   vocal cord removed  '2780s- [facility]   granuloma excised/benign   WRIST SURGERY  '02   left wrist surgery: ORIF for severe fracture        Home Medications    Prior to Admission medications   Medication Sig Start Date End Date Taking? Authorizing Provider  Ascorbic Acid (VITAMIN C PO) Take 1 tablet by mouth daily.   Yes [provider]  atorvastatin (LIPITOR) 10 MG tablet Take 1 tablet (10 mg total) by mouth daily. 11/10/14  Yes Nelwyn SalisburyFry, Stephen A, MD  Cholecalciferol (KP VITAMIN D) 1000 UNITS capsule Take 1,000 Units by mouth daily.     Yes [provider]  Coenzyme Q10 (CO Q-10) 100 MG CAPS Take 100 mg by mouth daily.    Yes [provider]  Cyanocobalamin (VITAMIN B-12 PO) Take 1 tablet by mouth daily.   Yes [provider]  finasteride (PROPECIA) 1 MG tablet Take 1 tablet (1 mg total) by mouth daily. 10/10/14  Yes Nelwyn SalisburyFry, Stephen A, MD  Flaxseed, Linseed, (FLAX SEED OIL PO) Take 1 capsule by mouth daily.   Yes [provider]  hyoscyamine (LEVBID) 0.375 MG 12 hr tablet Take 1 tablet (0.375 mg total) by mouth every 12 (twelve) hours as needed for cramping. 05/30/14  Yes Nelwyn SalisburyFry, Stephen A, MD  Multiple Vitamins-Minerals (HAIR SKIN NAILS PO) Take 1 tablet by mouth daily.   Yes [provider]  naproxen (NAPROSYN) 500  MG tablet Take 500 mg by mouth daily as needed (pain).   Yes [provider]  olmesartan-hydrochlorothiazide (BENICAR HCT) 20-12.5 MG tablet Take 1 tablet by mouth daily. 12/11/18  Yes [provider]  Omega-3 Fatty Acids (FISH OIL) 1000 MG CAPS Take 1,000 mg by mouth daily.    Yes [provider]  timolol (TIMOPTIC) 0.5 % ophthalmic solution Place 1 drop into both eyes daily. 07/22/12  Yes Norins, Rosalyn Gess, MD  valACYclovir (VALTREX) 1000 MG tablet Take 1,000 mg by mouth daily.  12/19/18  Yes [provider]  VITAMIN A PO Take 1 capsule by mouth daily.   Yes [provider]  furosemide (LASIX) 20 MG tablet TAKE 1 TABLET BY MOUTH EVERY DAY. Patient not taking: Reported on 01/15/2019 10/18/15   Nelwyn Salisbury, MD  LORazepam (ATIVAN) 1 MG tablet Take 1 tablet (1 mg total) by mouth daily. Patient not taking: Reported on 01/15/2019 10/14/14   Nelwyn Salisbury, MD  losartan (COZAAR) 50 MG tablet Take 1 tablet (50 mg total) by mouth daily. Patient not taking: Reported on 01/15/2019 11/10/14   Nelwyn Salisbury, MD  meclizine (ANTIVERT) 25 MG tablet Take 1 tablet (25 mg total) by mouth 3 (three) times daily as needed for dizziness or nausea. 01/16/19   Alvira Monday, MD    Family History Family History  Problem Relation Age of Onset   Diabetes Mother    Hypertension Father    Coronary artery disease Father    Heart disease Father        CAD/MI.   Colon cancer Paternal Grandmother    Cancer Neg Hx    COPD Neg Hx     Social History Social History   Tobacco Use   Smoking status: Former Smoker    Types: Cigarettes    Last attempt to quit: 11/05/1976    Years since quitting: 42.2   Smokeless tobacco: Never Used  Substance Use Topics   Alcohol use: Yes    Alcohol/week: 0.0 standard drinks    Comment: occ   Drug use: No     Allergies   Patient has no known allergies.   Review of Systems Review of Systems  Constitutional: Negative for fever.  HENT: Negative for sore throat.   Eyes: Negative for visual disturbance.  Respiratory: Negative for shortness of breath.   Cardiovascular: Negative for chest pain.  Gastrointestinal: Positive for nausea and vomiting. Negative for abdominal pain.  Genitourinary: Negative for difficulty urinating.  Musculoskeletal: Positive for gait problem. Negative for back pain and neck stiffness.  Skin: Negative for rash.  Neurological: Positive for dizziness. Negative for syncope, speech difficulty, weakness,  numbness and headaches.     Physical Exam Updated Vital Signs BP 134/79    Pulse 65    Temp 97.9 F (36.6 C) (Axillary)    Resp 16    Ht  (1.778 m)    Wt 73.5 kg    SpO2 99%    BMI 23.24 kg/m   Physical Exam Vitals signs and nursing note reviewed.  Constitutional:      General: He is not in acute distress.    Appearance: He is well-developed. He is not diaphoretic.  HENT:     Head: Normocephalic and atraumatic.  Eyes:     General: No visual field deficit.    Conjunctiva/sclera: Conjunctivae normal.  Neck:     Musculoskeletal: Normal range of motion.  Cardiovascular:     Rate and Rhythm: Normal rate and regular  rhythm.     Heart sounds: Normal heart sounds. No murmur. No friction rub. No gallop.   Pulmonary:     Effort: Pulmonary effort is normal. No respiratory distress.     Breath sounds: Normal breath sounds. No wheezing or rales.  Abdominal:     General: There is no distension.     Palpations: Abdomen is soft.     Tenderness: There is no abdominal tenderness. There is no guarding.  Skin:    General: Skin is warm and dry.  Neurological:     Mental Status: He is alert and oriented to person, place, and time.     GCS: GCS eye subscore is 4. GCS verbal subscore is 5. GCS motor subscore is 6.     Cranial Nerves: No dysarthria or facial asymmetry.     Sensory: Sensation is intact. No sensory deficit.     Motor: Motor function is intact.     Coordination: Coordination normal. Finger-Nose-Finger Test and Heel to Shin Test normal.     Comments: No vertical skew Resting nystagmus, present bilaterally        ED Treatments / Results  Labs (all labs ordered are listed, but only abnormal results are displayed) Labs Reviewed  CBC WITH DIFFERENTIAL/PLATELET - Abnormal; Notable for the following components:      Result Value   WBC 12.5 (*)    RBC 3.57 (*)    Hemoglobin 12.4 (*)    HCT 37.1 (*)    MCV 103.9 (*)    MCH 34.7 (*)    Neutro Abs 10.3 (*)    All other  components within normal limits  COMPREHENSIVE METABOLIC PANEL - Abnormal; Notable for the following components:   Potassium 3.3 (*)    CO2 21 (*)    Glucose, Bld 129 (*)    Calcium 8.7 (*)    Total Protein 6.4 (*)    All other components within normal limits  CBG MONITORING, ED - Abnormal; Notable for the following components:   Glucose-Capillary 120 (*)    All other components within normal limits  POCT I-STAT EG7 - Abnormal; Notable for the following components:   pH, Ven 7.476 (*)    pCO2, Ven 31.8 (*)    Potassium 3.3 (*)    Calcium, Ion 1.06 (*)    HCT 37.0 (*)    Hemoglobin 12.6 (*)    All other components within normal limits  I-STAT CREATININE, ED  I-STAT TROPONIN, ED    EKG EKG Interpretation  Date/Time:  Friday January 15 2019 19:01:03 EDT Ventricular Rate:  74 PR Interval:    QRS Duration: 115 QT Interval:  422 QTC Calculation: 469 R Axis:   50 Text Interpretation:  Sinus rhythm Nonspecific intraventricular conduction delay Anteroseptal infarct, age indeterminate No previous ECGs available Confirmed by Alvira Monday ([id number]) on 01/15/2019 8:26:48 PM   Radiology Ct Angio Head W Or Wo Contrast  Result Date: 01/15/2019 CLINICAL DATA:  Code stroke.  78 year old male with dizziness. EXAM: CT ANGIOGRAPHY HEAD AND NECK TECHNIQUE: Multidetector CT imaging of the head and neck was performed using the standard protocol during bolus administration of intravenous contrast. Multiplanar CT image reconstructions and MIPs were obtained to evaluate the vascular anatomy. Carotid stenosis measurements (when applicable) are obtained utilizing NASCET criteria, using the distal internal carotid diameter as the denominator. CONTRAST:  60mL OMNIPAQUE IOHEXOL 350 MG/ML SOLN COMPARISON:  Plain head CT 2113 hours today. Brain MRI 05/15/2010. FINDINGS: CTA NECK Skeleton: Osteopenia.  No acute osseous abnormality  identified. Upper chest: Mild dependent atelectasis. Otherwise negative. Other  neck: Negative. Aortic arch: Mild Calcified aortic atherosclerosis. 3 vessel arch configuration. No great vessel origin stenosis. Right carotid system: Mildly tortuous brachiocephalic artery. No right CCA or carotid bifurcation plaque or stenosis. Mildly tortuous cervical right ICA without stenosis. Left carotid system: Tortuous proximal left CCA. Minimal calcified plaque at the left carotid bifurcation without stenosis. Tortuous left ICA just below the skull base. Vertebral arteries: Mild right subclavian origin plaque without stenosis. Normal right vertebral artery origin. The right vertebral artery is non dominant and patent to the skull base without stenosis. Mild plaque in the proximal left subclavian artery without stenosis. Dominant left vertebral artery. Normal left vertebral origin. Tortuous left V1 segment. No left vertebral stenosis to the skull base. CTA HEAD Posterior circulation: The non dominant right vertebral functionally terminates in PICA. Normal left PICA origin. Dominant left vertebral supplies the basilar without stenosis. Tortuous vertebrobasilar junction and basilar artery. No basilar stenosis. Normal SCA and PCA origins. Posterior communicating arteries are diminutive or absent. Bilateral PCA branches are within normal limits. Anterior circulation: Both ICA siphons are patent. On the left there is supraclinoid calcified plaque with only mild stenosis. On the right there is mild calcified plaque with no stenosis. Patent carotid termini. Normal MCA and ACA origins. Anterior communicating artery and bilateral ACA branches are within normal limits. Left MCA M1 is tortuous. Left M1 and bifurcation are patent without stenosis. Left MCA branches are within normal limits. Right MCA M1 is tortuous. Right M1 and bifurcation are patent without stenosis. Right MCA branches are within normal limits. Venous sinuses: Patent. Anatomic variants: Dominant left vertebral artery which supplies the basilar. The  right vertebral functionally terminates in PICA. Review of the MIP images confirms the above findings IMPRESSION: 1. Negative for large vessel occlusion. 2. Tortuous vessels with mild atherosclerosis for age and no significant arterial stenosis in the head or neck. These results were communicated to Dr. Wilford Corner at 9:35 pm on 01/15/2019 by text page via the [facility] messaging system. Electronically Signed   By: Odessa Fleming M.D.   On: 01/15/2019 21:37   Ct Angio Neck W And/or Wo Contrast  Result Date: 01/15/2019 CLINICAL DATA:  Code stroke.  78 year old male with dizziness. EXAM: CT ANGIOGRAPHY HEAD AND NECK TECHNIQUE: Multidetector CT imaging of the head and neck was performed using the standard protocol during bolus administration of intravenous contrast. Multiplanar CT image reconstructions and MIPs were obtained to evaluate the vascular anatomy. Carotid stenosis measurements (when applicable) are obtained utilizing NASCET criteria, using the distal internal carotid diameter as the denominator. CONTRAST:  58mL OMNIPAQUE IOHEXOL 350 MG/ML SOLN COMPARISON:  Plain head CT 2113 hours today. Brain MRI 05/15/2010. FINDINGS: CTA NECK Skeleton: Osteopenia.  No acute osseous abnormality identified. Upper chest: Mild dependent atelectasis. Otherwise negative. Other neck: Negative. Aortic arch: Mild Calcified aortic atherosclerosis. 3 vessel arch configuration. No great vessel origin stenosis. Right carotid system: Mildly tortuous brachiocephalic artery. No right CCA or carotid bifurcation plaque or stenosis. Mildly tortuous cervical right ICA without stenosis. Left carotid system: Tortuous proximal left CCA. Minimal calcified plaque at the left carotid bifurcation without stenosis. Tortuous left ICA just below the skull base. Vertebral arteries: Mild right subclavian origin plaque without stenosis. Normal right vertebral artery origin. The right vertebral artery is non dominant and patent to the skull base without stenosis. Mild  plaque in the proximal left subclavian artery without stenosis. Dominant left vertebral artery. Normal left vertebral origin. Tortuous left  V1 segment. No left vertebral stenosis to the skull base. CTA HEAD Posterior circulation: The non dominant right vertebral functionally terminates in PICA. Normal left PICA origin. Dominant left vertebral supplies the basilar without stenosis. Tortuous vertebrobasilar junction and basilar artery. No basilar stenosis. Normal SCA and PCA origins. Posterior communicating arteries are diminutive or absent. Bilateral PCA branches are within normal limits. Anterior circulation: Both ICA siphons are patent. On the left there is supraclinoid calcified plaque with only mild stenosis. On the right there is mild calcified plaque with no stenosis. Patent carotid termini. Normal MCA and ACA origins. Anterior communicating artery and bilateral ACA branches are within normal limits. Left MCA M1 is tortuous. Left M1 and bifurcation are patent without stenosis. Left MCA branches are within normal limits. Right MCA M1 is tortuous. Right M1 and bifurcation are patent without stenosis. Right MCA branches are within normal limits. Venous sinuses: Patent. Anatomic variants: Dominant left vertebral artery which supplies the basilar. The right vertebral functionally terminates in PICA. Review of the MIP images confirms the above findings IMPRESSION: 1. Negative for large vessel occlusion. 2. Tortuous vessels with mild atherosclerosis for age and no significant arterial stenosis in the head or neck. These results were communicated to Dr. Wilford Corner at 9:35 pm on 01/15/2019 by text page via the [facility] messaging system. Electronically Signed   By: Odessa Fleming M.D.   On: 01/15/2019 21:37   Mr Brain Wo Contrast  Result Date: 01/15/2019 CLINICAL DATA:  Dizziness and vomiting.  Nystagmus. EXAM: MRI HEAD WITHOUT CONTRAST TECHNIQUE: Multiplanar, multiecho pulse sequences of the brain and surrounding structures were  obtained without intravenous contrast. COMPARISON:  CTA head neck 01/15/2019 FINDINGS: BRAIN: There is no acute infarct, acute hemorrhage, hydrocephalus or extra-axial collection. The midline structures are normal. No midline shift or other mass effect. Multifocal white matter hyperintensity, most commonly due to chronic ischemic microangiopathy. The cerebral and cerebellar volume are age-appropriate. Susceptibility-sensitive sequences show no chronic microhemorrhage or superficial siderosis. VASCULAR: Major intracranial arterial and venous sinus flow voids are normal. SKULL AND UPPER CERVICAL SPINE: Calvarial bone marrow signal is normal. There is no skull base mass. Visualized upper cervical spine and soft tissues are normal. SINUSES/ORBITS: No fluid levels or advanced mucosal thickening. Minimal left mastoid fluid. The orbits are normal. IMPRESSION: Mild chronic small vessel disease without acute intracranial abnormality. Electronically Signed   By: Deatra Robinson M.D.   On: 01/15/2019 22:22   Ct Head Code Stroke Wo Contrast  Result Date: 01/15/2019 CLINICAL DATA:  Code stroke.  78 year old male with dizziness. EXAM: CT HEAD WITHOUT CONTRAST TECHNIQUE: Contiguous axial images were obtained from the base of the skull through the vertex without intravenous contrast. COMPARISON:  Head CT 07/28/2013. Brain MRI 05/15/2010. FINDINGS: Brain: Cerebral volume is within normal limits for age. No midline shift, ventriculomegaly, mass effect, evidence of mass lesion, intracranial hemorrhage or evidence of cortically based acute infarction. Gray-white matter differentiation is within normal limits throughout the brain. Vascular: Calcified atherosclerosis at the skull base. Dominant left vertebral artery. No suspicious intracranial vascular hyperdensity. Skull: No acute osseous abnormality identified. Sinuses/Orbits: Tympanic cavities are clear. Visualized paranasal sinuses and mastoids are stable and well pneumatized.  Other: Postoperative changes to both globes from the prior CT. No acute orbit or scalp soft tissue finding. ASPECTS [facility] Stroke Program Early CT Score) - Ganglionic level infarction (caudate, lentiform nuclei, internal capsule, insula, M1-M3 cortex): 7 - Supraganglionic infarction (M4-M6 cortex): 3 Total score (0-10 with 10 being normal): 10 IMPRESSION: 1. Stable  and normal for age non contrast CT appearance of the brain. ASPECTS is 10. 2. These results were communicated to Dr. Wilford Corner at 9:27 pm on 01/15/2019 by text page via the [facility] messaging system. Electronically Signed   By: Odessa Fleming M.D.   On: 01/15/2019 21:28    Procedures Procedures (including critical care time)  Medications Ordered in ED Medications  meclizine (ANTIVERT) tablet 25 mg (25 mg Oral Given 01/15/19 2212)  ondansetron (ZOFRAN) injection 4 mg (4 mg Intravenous Given 01/15/19 2132)  iohexol (OMNIPAQUE) 350 MG/ML injection 75 mL (75 mLs Intravenous Contrast Given 01/15/19 2121)  sodium chloride 0.9 % bolus 1,000 mL (0 mLs Intravenous Stopped 01/15/19 2305)  LORazepam (ATIVAN) injection 1 mg (1 mg Intravenous Given 01/15/19 2212)     Initial Impression / Assessment and Plan / ED Course  I have reviewed the triage vital signs and the nursing notes.  Pertinent labs & imaging results that were available during my care of the patient were reviewed by me and considered in my medical decision making (see chart for details).        78 year old male with a history hypertension, hyperlipidemia, right since known neural hearing loss, presents with acute vertigo.  Differential diagnosis for vertigo includes CVA, ICH, or peripheral vertigo.  History with factors most consistent with peripheral vertigo, however nystagmus at rest concerning for possible central etiology, given risk factors, discussed with Dr. Wilford Corner and called a code stroke.  CT a head and neck were completed which showed no acute abnormalities.  MRI showed no evidence of  CVA.  Discussed with Dr. Channing Mutters if symptoms persist despite symptomatic treatment for peripheral vertigo, would recommend admission for further work-up, however if he is improved, more likely in setting of negative MRI that this is peripheral vertigo.  Patient was given meclizine, Ativan with improvement of symptoms, he is able to sit up and ambulate independently with a steady gait and without ataxia.    Given negative MRI, improvement with treatment, that symptoms indicate peripheral vertigo. Given rx for meclizine. Patient discharged in stable condition with understanding of reasons to return.   Final Clinical Impressions(s) / ED Diagnoses   Final diagnoses:  Vertigo    ED Discharge Orders         Ordered    meclizine (ANTIVERT) 25 MG tablet  3 times daily PRN     01/16/19 0015           Alvira Monday, MD 01/16/19 0106

## 2019-01-15 NOTE — ED Triage Notes (Signed)
Pt BIB [facility]. Pt was eating at home when he stood up and had a sudden onset of dizziness and vomiting. EMS reports that patient had nystagmus upon their assessment. Pt was given 4mg  of zofran by EMS for vomiting.

## 2019-01-15 NOTE — ED Notes (Signed)
To CT at this time.

## 2019-01-16 MED ORDER — MECLIZINE HCL 25 MG PO TABS: 25.0000 mg | ORAL_TABLET | Freq: Three times a day (TID) | ORAL | 0 refills | Status: AC | PRN

## 2019-01-16 NOTE — ED Notes (Signed)
Reviewed d/c instructions with pt, who verbalized understanding and had no outstanding questions. Pt armband & labels removed and placed in shred bin. Pt departed in NAD, refused use of wheelchair.   

## 2019-05-19 DIAGNOSIS — Z961 Presence of intraocular lens: Secondary | ICD-10-CM | POA: Diagnosis not present

## 2019-05-19 DIAGNOSIS — H26493 Other secondary cataract, bilateral: Secondary | ICD-10-CM | POA: Diagnosis not present

## 2019-05-19 DIAGNOSIS — H401331 Pigmentary glaucoma, bilateral, mild stage: Secondary | ICD-10-CM | POA: Diagnosis not present

## 2019-09-02 DIAGNOSIS — Z0120 Encounter for dental examination and cleaning without abnormal findings: Secondary | ICD-10-CM | POA: Diagnosis not present

## 2019-09-22 DIAGNOSIS — Z20828 Contact with and (suspected) exposure to other viral communicable diseases: Secondary | ICD-10-CM | POA: Diagnosis not present

## 2019-11-24 ENCOUNTER — Ambulatory Visit: Payer: Medicare Other | Attending: Internal Medicine

## 2019-11-24 DIAGNOSIS — Z23 Encounter for immunization: Secondary | ICD-10-CM

## 2019-12-06 DIAGNOSIS — H26493 Other secondary cataract, bilateral: Secondary | ICD-10-CM | POA: Diagnosis not present

## 2019-12-06 DIAGNOSIS — H401331 Pigmentary glaucoma, bilateral, mild stage: Secondary | ICD-10-CM | POA: Diagnosis not present

## 2019-12-08 DIAGNOSIS — D1721 Benign lipomatous neoplasm of skin and subcutaneous tissue of right arm: Secondary | ICD-10-CM | POA: Diagnosis not present

## 2019-12-08 DIAGNOSIS — D1722 Benign lipomatous neoplasm of skin and subcutaneous tissue of left arm: Secondary | ICD-10-CM | POA: Diagnosis not present

## 2019-12-08 DIAGNOSIS — L821 Other seborrheic keratosis: Secondary | ICD-10-CM | POA: Diagnosis not present

## 2019-12-08 DIAGNOSIS — L820 Inflamed seborrheic keratosis: Secondary | ICD-10-CM | POA: Diagnosis not present

## 2019-12-13 ENCOUNTER — Ambulatory Visit: Payer: Medicare Other | Attending: Internal Medicine

## 2019-12-13 DIAGNOSIS — Z23 Encounter for immunization: Secondary | ICD-10-CM | POA: Insufficient documentation

## 2019-12-13 NOTE — Progress Notes (Signed)
   Covid-19 Vaccination Clinic  Name:  SPURGEON GANCARZ    MRN: [id number] DOB: 1940/11/24  12/13/2019  Mr. Langbehn was observed post Covid-19 immunization for 15 minutes without incidence. He was provided with Vaccine Information Sheet and instruction to access the V-Safe system.   Mr. Reinheimer was instructed to call 911 with any severe reactions post vaccine: Marland Kitchen Difficulty breathing  . Swelling of your face and throat  . A fast heartbeat  . A bad rash all over your body  . Dizziness and weakness    Immunizations Administered    Name Date Dose VIS Date Route   Pfizer COVID-19 Vaccine 12/13/2019  2:17 PM 0.3 mL 10/15/2019 Intramuscular   Manufacturer: [employer], [employer]   Lot: [id number]   NDC: [address]-2

## 2019-12-17 DIAGNOSIS — I10 Essential (primary) hypertension: Secondary | ICD-10-CM | POA: Diagnosis not present

## 2019-12-17 DIAGNOSIS — Z0000 Encounter for general adult medical examination without abnormal findings: Secondary | ICD-10-CM | POA: Diagnosis not present

## 2019-12-17 DIAGNOSIS — E7849 Other hyperlipidemia: Secondary | ICD-10-CM | POA: Diagnosis not present

## 2019-12-17 DIAGNOSIS — R7301 Impaired fasting glucose: Secondary | ICD-10-CM | POA: Diagnosis not present

## 2019-12-20 DIAGNOSIS — R82998 Other abnormal findings in urine: Secondary | ICD-10-CM | POA: Diagnosis not present

## 2019-12-20 DIAGNOSIS — I10 Essential (primary) hypertension: Secondary | ICD-10-CM | POA: Diagnosis not present

## 2019-12-21 DIAGNOSIS — Z1212 Encounter for screening for malignant neoplasm of rectum: Secondary | ICD-10-CM | POA: Diagnosis not present

## 2019-12-24 DIAGNOSIS — I10 Essential (primary) hypertension: Secondary | ICD-10-CM | POA: Diagnosis not present

## 2019-12-24 DIAGNOSIS — E785 Hyperlipidemia, unspecified: Secondary | ICD-10-CM | POA: Diagnosis not present

## 2019-12-24 DIAGNOSIS — Z0000 Encounter for general adult medical examination without abnormal findings: Secondary | ICD-10-CM | POA: Diagnosis not present

## 2019-12-24 DIAGNOSIS — R7301 Impaired fasting glucose: Secondary | ICD-10-CM | POA: Diagnosis not present

## 2020-06-14 DIAGNOSIS — H401331 Pigmentary glaucoma, bilateral, mild stage: Secondary | ICD-10-CM | POA: Diagnosis not present

## 2020-08-28 DIAGNOSIS — Z0120 Encounter for dental examination and cleaning without abnormal findings: Secondary | ICD-10-CM | POA: Diagnosis not present

## 2020-12-13 DIAGNOSIS — B0052 Herpesviral keratitis: Secondary | ICD-10-CM | POA: Diagnosis not present

## 2020-12-13 DIAGNOSIS — H26493 Other secondary cataract, bilateral: Secondary | ICD-10-CM | POA: Diagnosis not present

## 2020-12-13 DIAGNOSIS — H401331 Pigmentary glaucoma, bilateral, mild stage: Secondary | ICD-10-CM | POA: Diagnosis not present

## 2020-12-20 DIAGNOSIS — Z125 Encounter for screening for malignant neoplasm of prostate: Secondary | ICD-10-CM | POA: Diagnosis not present

## 2020-12-20 DIAGNOSIS — R7301 Impaired fasting glucose: Secondary | ICD-10-CM | POA: Diagnosis not present

## 2020-12-20 DIAGNOSIS — E785 Hyperlipidemia, unspecified: Secondary | ICD-10-CM | POA: Diagnosis not present

## 2020-12-27 DIAGNOSIS — R82998 Other abnormal findings in urine: Secondary | ICD-10-CM | POA: Diagnosis not present

## 2020-12-27 DIAGNOSIS — Z1212 Encounter for screening for malignant neoplasm of rectum: Secondary | ICD-10-CM | POA: Diagnosis not present

## 2021-01-01 DIAGNOSIS — L218 Other seborrheic dermatitis: Secondary | ICD-10-CM | POA: Diagnosis not present

## 2021-01-01 DIAGNOSIS — L812 Freckles: Secondary | ICD-10-CM | POA: Diagnosis not present

## 2021-01-01 DIAGNOSIS — L820 Inflamed seborrheic keratosis: Secondary | ICD-10-CM | POA: Diagnosis not present

## 2021-01-01 DIAGNOSIS — D1801 Hemangioma of skin and subcutaneous tissue: Secondary | ICD-10-CM | POA: Diagnosis not present

## 2021-01-23 IMAGING — MR MRI HEAD WITHOUT CONTRAST
13 of 14 series · 44 of 48 positions shown · non-contrast
Comparison: CTA head neck 01/15/2019

CLINICAL DATA: Dizziness and vomiting.  Nystagmus.

EXAM:
MRI HEAD WITHOUT CONTRAST
TECHNIQUE: Multiplanar, multiecho pulse sequences of the brain and surrounding
structures were obtained without intravenous contrast.

[Series 5: DWI · axial · 3.0mm · 0.88mm/px · z∈[-69,+83]mm · 5 of 104 slices shown (1 of 4)]
[im 1/104]
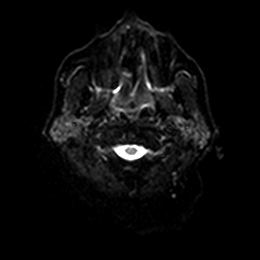
[im 26/104]
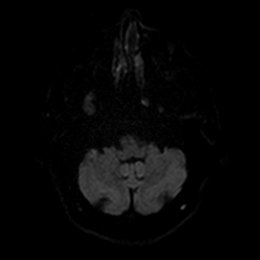
[im 52/104]
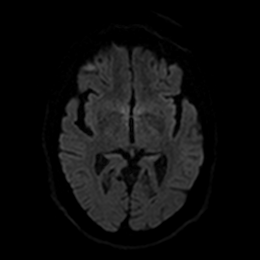
[im 78/104]
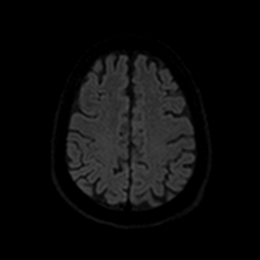
[im 104/104]
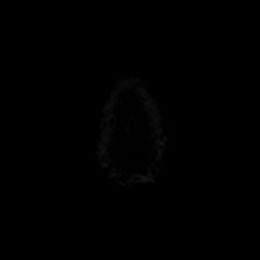

[Series 6: DWI · axial · 3.0mm · 0.88mm/px · z∈[-69,+83]mm · 3 of 52 slices shown (2 of 4)]
[im 1/52]
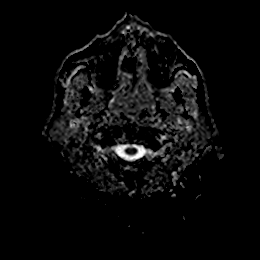
[im 26/52]
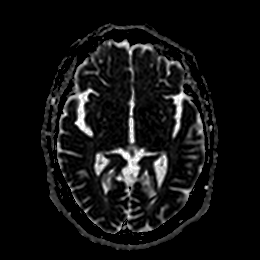
[im 52/52]
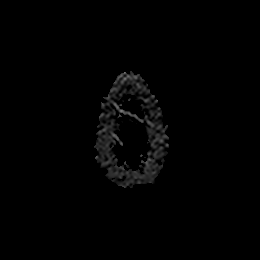

[Series 7: cor dwi_tracew · coronal · 4.0mm · 0.88mm/px · 5 of 76 slices shown]
[im 1/76]
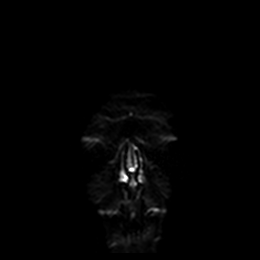
[im 19/76]
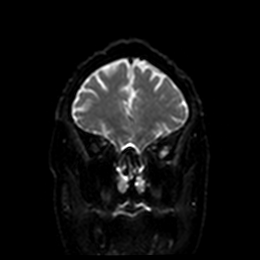
[im 38/76]
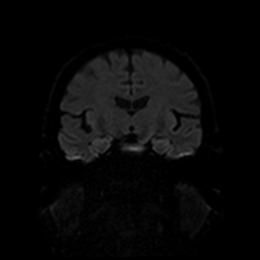
[im 57/76]
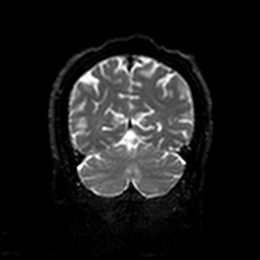
[im 76/76]
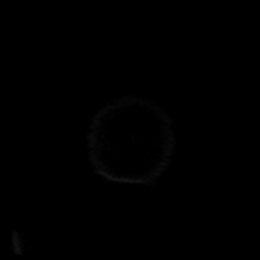

[Series 8: cor dwi_adc · coronal · 4.0mm · 0.88mm/px · 2 of 37 slices shown]
[im 1/37]
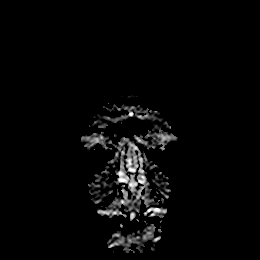
[im 37/37]
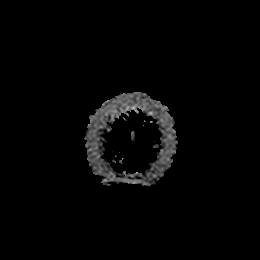

[Series 9: FLAIR · axial · 5.0mm · 0.45mm/px · z∈[-70,+72]mm · 2 of 25 slices shown]
[im 1/25]
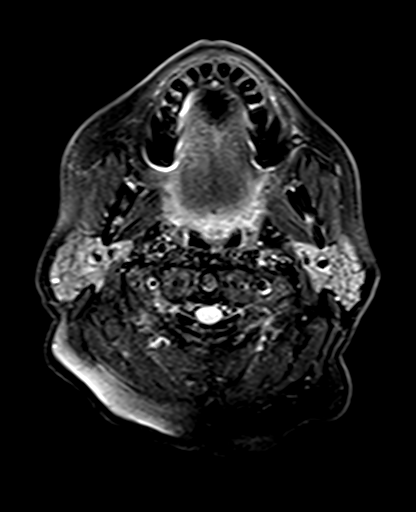
[im 25/25]
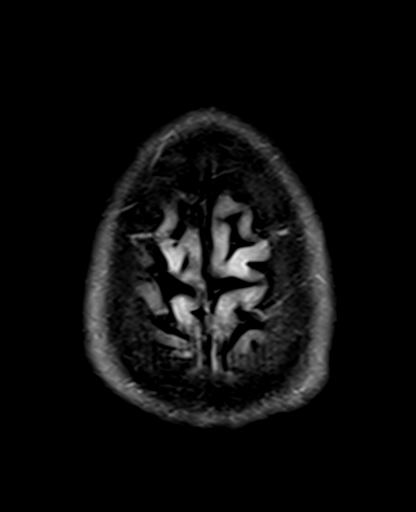

[Series 10: DWI · axial · 2.0mm · 0.77mm/px · z∈[-96,+21]mm · 7 of 120 slices shown (3 of 4)]
[im 1/120]
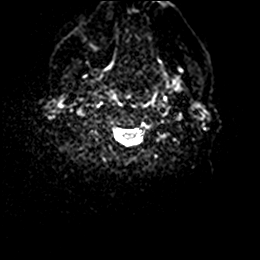
[im 20/120]
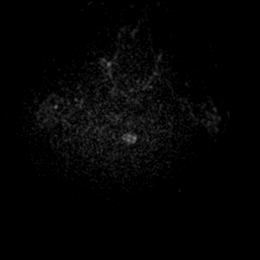
[im 40/120]
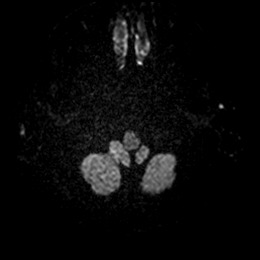
[im 60/120]
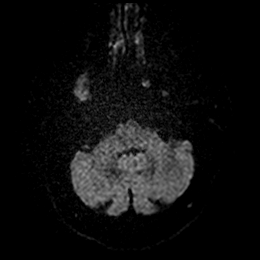
[im 80/120]
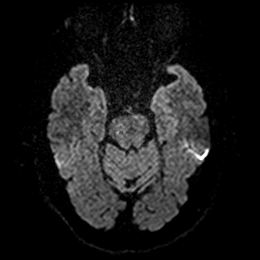
[im 100/120]
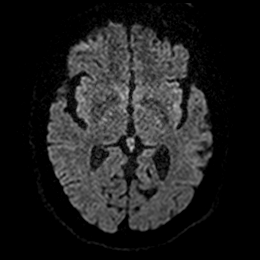
[im 120/120]
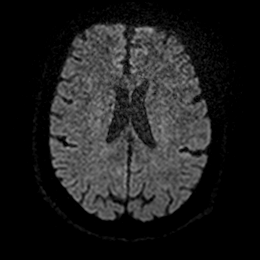

[Series 11: DWI · axial · 2.0mm · 0.77mm/px · z∈[-96,+21]mm · 4 of 60 slices shown (4 of 4)]
[im 1/60]
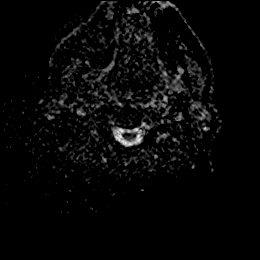
[im 20/60]
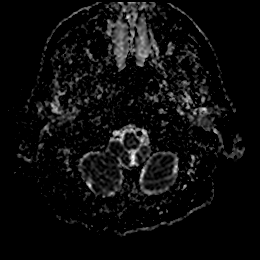
[im 40/60]
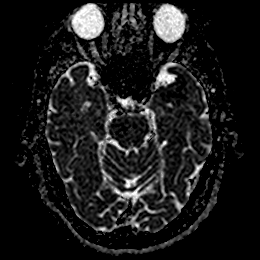
[im 60/60]
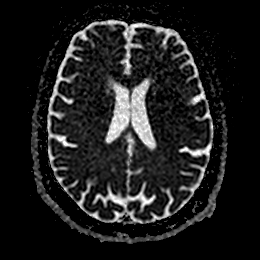

[Series 13: pha_images · axial · 3.0mm · 0.90mm/px · z∈[-86,+89]mm · 4 of 58 slices shown]
[im 1/58]
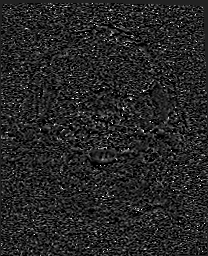
[im 20/58]
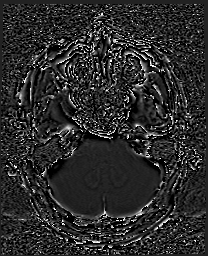
[im 39/58]
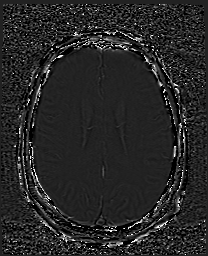
[im 58/58]
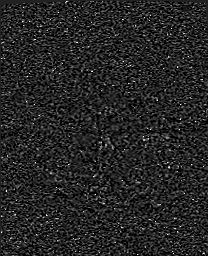

[Series 14: swi_images · axial · 3.0mm · 0.90mm/px · z∈[-86,+89]mm · 4 of 60 slices shown]
[im 1/60]
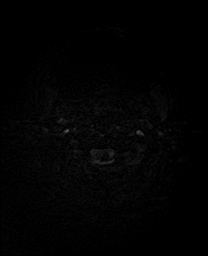
[im 20/60]
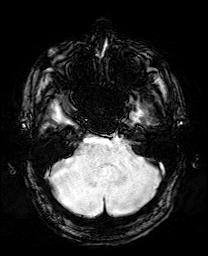
[im 40/60]
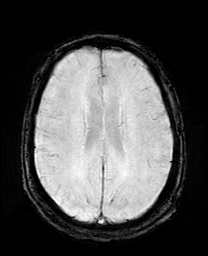
[im 60/60]
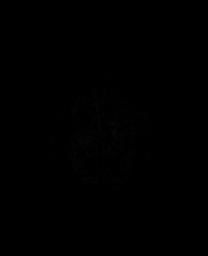

[Series 15: mip_images(sw) · axial · 24.0mm · 0.90mm/px · z∈[-76,+79]mm · 3 of 53 slices shown]
[im 1/53]
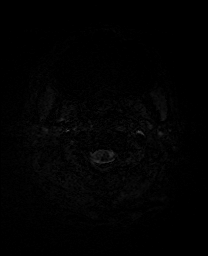
[im 27/53]
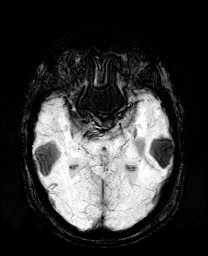
[im 53/53]
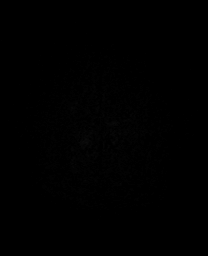

[Series 16: T1 · sagittal · 5.0mm · 0.78mm/px · 1 of 20 slices shown]
[im 1/20]
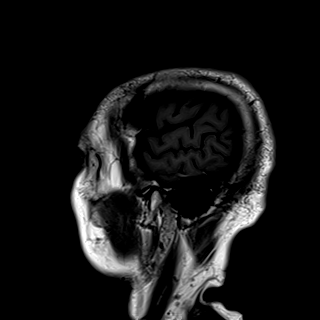

[Series 17: T2 · axial · 5.0mm · 0.72mm/px · z∈[-73,+69]mm · 2 of 25 slices shown (1 of 2)]
[im 1/25]
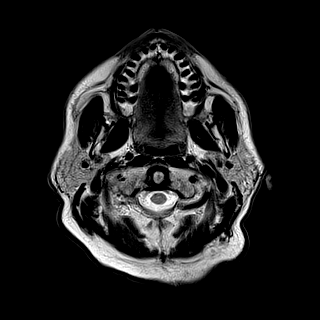
[im 25/25]
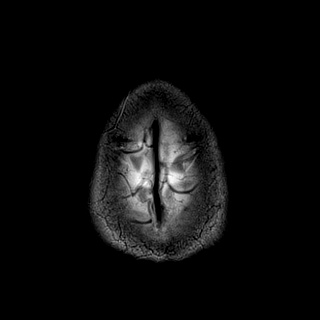

[Series 19: T2 · coronal · 5.0mm · 0.34mm/px · 2 of 29 slices shown (2 of 2)]
[im 1/29]
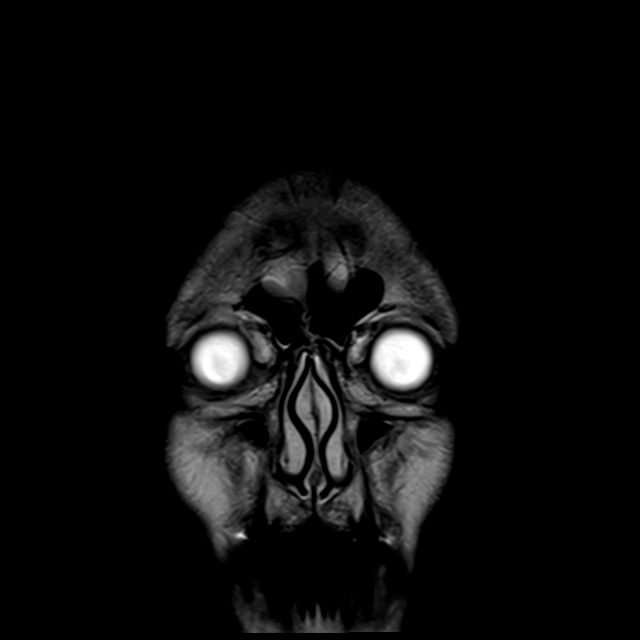
[im 29/29]
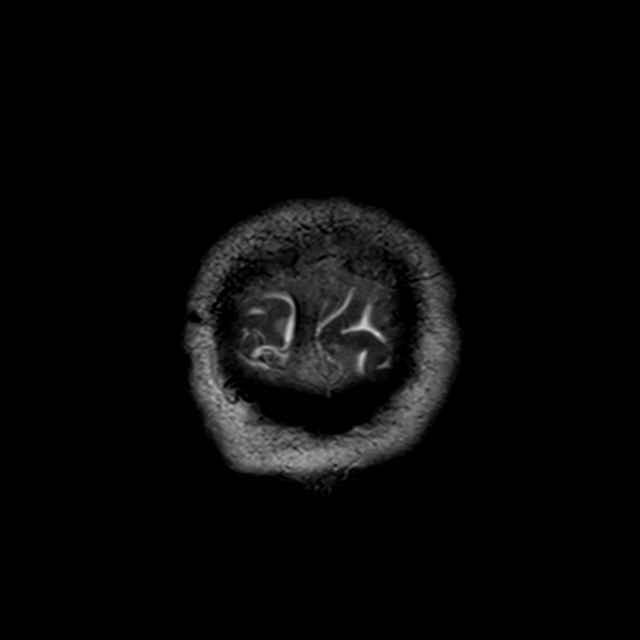

[44 of 48 positions shown; findings below may reference images not displayed]

FINDINGS: BRAIN: There is no acute infarct, acute hemorrhage, hydrocephalus or
extra-axial collection. The midline structures are normal. No
midline shift or other mass effect. Multifocal white matter
hyperintensity, most commonly due to chronic ischemic
microangiopathy. The cerebral and cerebellar volume are
age-appropriate. Susceptibility-sensitive sequences show no chronic
microhemorrhage or superficial siderosis.

VASCULAR: Major intracranial arterial and venous sinus flow voids
are normal.

SKULL AND UPPER CERVICAL SPINE: Calvarial bone marrow signal is
normal. There is no skull base mass. Visualized upper cervical spine
and soft tissues are normal.

SINUSES/ORBITS: No fluid levels or advanced mucosal thickening.
Minimal left mastoid fluid. The orbits are normal.
IMPRESSION: Mild chronic small vessel disease without acute intracranial
abnormality.

## 2021-03-02 DIAGNOSIS — E785 Hyperlipidemia, unspecified: Secondary | ICD-10-CM | POA: Diagnosis not present

## 2021-12-18 DIAGNOSIS — H26493 Other secondary cataract, bilateral: Secondary | ICD-10-CM | POA: Diagnosis not present

## 2021-12-18 DIAGNOSIS — H401331 Pigmentary glaucoma, bilateral, mild stage: Secondary | ICD-10-CM | POA: Diagnosis not present

## 2021-12-18 DIAGNOSIS — H5000 Unspecified esotropia: Secondary | ICD-10-CM | POA: Diagnosis not present

## 2021-12-25 DIAGNOSIS — L821 Other seborrheic keratosis: Secondary | ICD-10-CM | POA: Diagnosis not present

## 2021-12-25 DIAGNOSIS — L812 Freckles: Secondary | ICD-10-CM | POA: Diagnosis not present

## 2021-12-25 DIAGNOSIS — D1801 Hemangioma of skin and subcutaneous tissue: Secondary | ICD-10-CM | POA: Diagnosis not present

## 2021-12-25 DIAGNOSIS — L308 Other specified dermatitis: Secondary | ICD-10-CM | POA: Diagnosis not present

## 2022-01-16 DIAGNOSIS — I10 Essential (primary) hypertension: Secondary | ICD-10-CM | POA: Diagnosis not present

## 2022-01-16 DIAGNOSIS — R7301 Impaired fasting glucose: Secondary | ICD-10-CM | POA: Diagnosis not present

## 2022-01-16 DIAGNOSIS — Z125 Encounter for screening for malignant neoplasm of prostate: Secondary | ICD-10-CM | POA: Diagnosis not present

## 2022-01-16 DIAGNOSIS — E785 Hyperlipidemia, unspecified: Secondary | ICD-10-CM | POA: Diagnosis not present

## 2022-01-23 DIAGNOSIS — R82998 Other abnormal findings in urine: Secondary | ICD-10-CM | POA: Diagnosis not present

## 2022-01-30 DIAGNOSIS — H26491 Other secondary cataract, right eye: Secondary | ICD-10-CM | POA: Diagnosis not present

## 2022-12-25 DIAGNOSIS — L812 Freckles: Secondary | ICD-10-CM | POA: Diagnosis not present

## 2022-12-25 DIAGNOSIS — L821 Other seborrheic keratosis: Secondary | ICD-10-CM | POA: Diagnosis not present

## 2022-12-25 DIAGNOSIS — L820 Inflamed seborrheic keratosis: Secondary | ICD-10-CM | POA: Diagnosis not present

## 2022-12-25 DIAGNOSIS — L738 Other specified follicular disorders: Secondary | ICD-10-CM | POA: Diagnosis not present

## 2022-12-25 DIAGNOSIS — D0339 Melanoma in situ of other parts of face: Secondary | ICD-10-CM | POA: Diagnosis not present

## 2022-12-25 DIAGNOSIS — D1801 Hemangioma of skin and subcutaneous tissue: Secondary | ICD-10-CM | POA: Diagnosis not present

## 2023-01-27 DIAGNOSIS — I10 Essential (primary) hypertension: Secondary | ICD-10-CM | POA: Diagnosis not present

## 2023-01-27 DIAGNOSIS — R7301 Impaired fasting glucose: Secondary | ICD-10-CM | POA: Diagnosis not present

## 2023-01-27 DIAGNOSIS — E785 Hyperlipidemia, unspecified: Secondary | ICD-10-CM | POA: Diagnosis not present

## 2023-01-27 DIAGNOSIS — M810 Age-related osteoporosis without current pathological fracture: Secondary | ICD-10-CM | POA: Diagnosis not present

## 2023-01-27 DIAGNOSIS — Z125 Encounter for screening for malignant neoplasm of prostate: Secondary | ICD-10-CM | POA: Diagnosis not present

## 2023-01-27 DIAGNOSIS — R7989 Other specified abnormal findings of blood chemistry: Secondary | ICD-10-CM | POA: Diagnosis not present

## 2023-01-28 DIAGNOSIS — D0339 Melanoma in situ of other parts of face: Secondary | ICD-10-CM | POA: Diagnosis not present

## 2023-02-06 DIAGNOSIS — I10 Essential (primary) hypertension: Secondary | ICD-10-CM | POA: Diagnosis not present

## 2023-02-06 DIAGNOSIS — Z0000 Encounter for general adult medical examination without abnormal findings: Secondary | ICD-10-CM | POA: Diagnosis not present

## 2023-02-06 DIAGNOSIS — Z1331 Encounter for screening for depression: Secondary | ICD-10-CM | POA: Diagnosis not present

## 2023-02-06 DIAGNOSIS — R7301 Impaired fasting glucose: Secondary | ICD-10-CM | POA: Diagnosis not present

## 2023-02-06 DIAGNOSIS — R82998 Other abnormal findings in urine: Secondary | ICD-10-CM | POA: Diagnosis not present

## 2023-02-06 DIAGNOSIS — Z1339 Encounter for screening examination for other mental health and behavioral disorders: Secondary | ICD-10-CM | POA: Diagnosis not present

## 2023-02-06 DIAGNOSIS — M810 Age-related osteoporosis without current pathological fracture: Secondary | ICD-10-CM | POA: Diagnosis not present

## 2023-02-06 DIAGNOSIS — E785 Hyperlipidemia, unspecified: Secondary | ICD-10-CM | POA: Diagnosis not present

## 2023-02-06 DIAGNOSIS — Z23 Encounter for immunization: Secondary | ICD-10-CM | POA: Diagnosis not present

## 2023-02-28 DIAGNOSIS — H21233 Degeneration of iris (pigmentary), bilateral: Secondary | ICD-10-CM | POA: Diagnosis not present

## 2023-02-28 DIAGNOSIS — Z961 Presence of intraocular lens: Secondary | ICD-10-CM | POA: Diagnosis not present

## 2023-02-28 DIAGNOSIS — H26492 Other secondary cataract, left eye: Secondary | ICD-10-CM | POA: Diagnosis not present

## 2023-02-28 DIAGNOSIS — H40023 Open angle with borderline findings, high risk, bilateral: Secondary | ICD-10-CM | POA: Diagnosis not present

## 2023-03-14 DIAGNOSIS — H40013 Open angle with borderline findings, low risk, bilateral: Secondary | ICD-10-CM | POA: Diagnosis not present

## 2023-03-14 DIAGNOSIS — H21233 Degeneration of iris (pigmentary), bilateral: Secondary | ICD-10-CM | POA: Diagnosis not present

## 2023-03-14 DIAGNOSIS — H26492 Other secondary cataract, left eye: Secondary | ICD-10-CM | POA: Diagnosis not present

## 2023-04-24 DIAGNOSIS — L723 Sebaceous cyst: Secondary | ICD-10-CM | POA: Diagnosis not present

## 2023-06-16 DIAGNOSIS — D2261 Melanocytic nevi of right upper limb, including shoulder: Secondary | ICD-10-CM | POA: Diagnosis not present

## 2023-06-16 DIAGNOSIS — Z85820 Personal history of malignant melanoma of skin: Secondary | ICD-10-CM | POA: Diagnosis not present

## 2023-06-16 DIAGNOSIS — L309 Dermatitis, unspecified: Secondary | ICD-10-CM | POA: Diagnosis not present

## 2023-06-16 DIAGNOSIS — L821 Other seborrheic keratosis: Secondary | ICD-10-CM | POA: Diagnosis not present

## 2023-09-17 DIAGNOSIS — K080 Exfoliation of teeth due to systemic causes: Secondary | ICD-10-CM | POA: Diagnosis not present

## 2024-01-27 DIAGNOSIS — Z85820 Personal history of malignant melanoma of skin: Secondary | ICD-10-CM | POA: Diagnosis not present

## 2024-01-27 DIAGNOSIS — L570 Actinic keratosis: Secondary | ICD-10-CM | POA: Diagnosis not present

## 2024-01-27 DIAGNOSIS — D1801 Hemangioma of skin and subcutaneous tissue: Secondary | ICD-10-CM | POA: Diagnosis not present

## 2024-01-27 DIAGNOSIS — L812 Freckles: Secondary | ICD-10-CM | POA: Diagnosis not present

## 2024-01-27 DIAGNOSIS — L821 Other seborrheic keratosis: Secondary | ICD-10-CM | POA: Diagnosis not present

## 2024-01-27 DIAGNOSIS — L738 Other specified follicular disorders: Secondary | ICD-10-CM | POA: Diagnosis not present

## 2024-02-04 DIAGNOSIS — R7301 Impaired fasting glucose: Secondary | ICD-10-CM | POA: Diagnosis not present

## 2024-02-04 DIAGNOSIS — E785 Hyperlipidemia, unspecified: Secondary | ICD-10-CM | POA: Diagnosis not present

## 2024-02-04 DIAGNOSIS — Z125 Encounter for screening for malignant neoplasm of prostate: Secondary | ICD-10-CM | POA: Diagnosis not present

## 2024-02-04 DIAGNOSIS — M810 Age-related osteoporosis without current pathological fracture: Secondary | ICD-10-CM | POA: Diagnosis not present

## 2024-02-11 DIAGNOSIS — Z1331 Encounter for screening for depression: Secondary | ICD-10-CM | POA: Diagnosis not present

## 2024-02-11 DIAGNOSIS — I10 Essential (primary) hypertension: Secondary | ICD-10-CM | POA: Diagnosis not present

## 2024-02-11 DIAGNOSIS — Z1339 Encounter for screening examination for other mental health and behavioral disorders: Secondary | ICD-10-CM | POA: Diagnosis not present

## 2024-02-11 DIAGNOSIS — Z0000 Encounter for general adult medical examination without abnormal findings: Secondary | ICD-10-CM | POA: Diagnosis not present

## 2024-03-16 DIAGNOSIS — H21233 Degeneration of iris (pigmentary), bilateral: Secondary | ICD-10-CM | POA: Diagnosis not present

## 2024-03-16 DIAGNOSIS — H40013 Open angle with borderline findings, low risk, bilateral: Secondary | ICD-10-CM | POA: Diagnosis not present

## 2024-03-16 DIAGNOSIS — H26492 Other secondary cataract, left eye: Secondary | ICD-10-CM | POA: Diagnosis not present

## 2024-03-16 DIAGNOSIS — H5000 Unspecified esotropia: Secondary | ICD-10-CM | POA: Diagnosis not present

## 2024-07-29 DIAGNOSIS — L820 Inflamed seborrheic keratosis: Secondary | ICD-10-CM | POA: Diagnosis not present

## 2024-07-29 DIAGNOSIS — D2262 Melanocytic nevi of left upper limb, including shoulder: Secondary | ICD-10-CM | POA: Diagnosis not present

## 2024-07-29 DIAGNOSIS — L218 Other seborrheic dermatitis: Secondary | ICD-10-CM | POA: Diagnosis not present

## 2024-07-29 DIAGNOSIS — Z85820 Personal history of malignant melanoma of skin: Secondary | ICD-10-CM | POA: Diagnosis not present

## 2024-07-29 DIAGNOSIS — L821 Other seborrheic keratosis: Secondary | ICD-10-CM | POA: Diagnosis not present

## 2024-08-10 DIAGNOSIS — K080 Exfoliation of teeth due to systemic causes: Secondary | ICD-10-CM | POA: Diagnosis not present

## 2024-09-06 DIAGNOSIS — K080 Exfoliation of teeth due to systemic causes: Secondary | ICD-10-CM | POA: Diagnosis not present

## 2024-09-23 NOTE — Progress Notes (Signed)
 Timothy Savage                                          MRN: [id number]   09/23/2024   The [facility] Quality Team Specialist reviewed this patient medical record for the purposes of chart review for care gap closure. The following were reviewed: chart review for care gap closure-controlling blood pressure.    [facility] Quality Team
